# Patient Record
Sex: Male | Born: 1960
Health system: Southern US, Community
[De-identification: ages and names within clinical notes are randomized; demographics above are authoritative.]

## PROBLEM LIST (undated history)

## (undated) DIAGNOSIS — R7303 Prediabetes: Secondary | ICD-10-CM

## (undated) DIAGNOSIS — E663 Overweight: Secondary | ICD-10-CM

## (undated) DIAGNOSIS — K429 Umbilical hernia without obstruction or gangrene: Secondary | ICD-10-CM

## (undated) DIAGNOSIS — Z973 Presence of spectacles and contact lenses: Secondary | ICD-10-CM

## (undated) DIAGNOSIS — E291 Testicular hypofunction: Secondary | ICD-10-CM

## (undated) DIAGNOSIS — E230 Hypopituitarism: Secondary | ICD-10-CM

## (undated) DIAGNOSIS — R300 Dysuria: Secondary | ICD-10-CM

## (undated) DIAGNOSIS — N200 Calculus of kidney: Secondary | ICD-10-CM

## (undated) DIAGNOSIS — Z87442 Personal history of urinary calculi: Secondary | ICD-10-CM

## (undated) DIAGNOSIS — I10 Essential (primary) hypertension: Secondary | ICD-10-CM

## (undated) DIAGNOSIS — C4491 Basal cell carcinoma of skin, unspecified: Secondary | ICD-10-CM

## (undated) DIAGNOSIS — R600 Localized edema: Secondary | ICD-10-CM

## (undated) DIAGNOSIS — Z9989 Dependence on other enabling machines and devices: Secondary | ICD-10-CM

## (undated) DIAGNOSIS — G4733 Obstructive sleep apnea (adult) (pediatric): Secondary | ICD-10-CM

## (undated) HISTORY — DX: Dysuria: R30.0

## (undated) HISTORY — DX: Localized edema: R60.0

## (undated) HISTORY — DX: Calculus of kidney: N20.0

## (undated) HISTORY — PX: COLONOSCOPY: SHX174

## (undated) HISTORY — PX: PATELLECTOMY: SHX1022

## (undated) HISTORY — DX: Overweight: E66.3

## (undated) HISTORY — PX: EXTRACORPOREAL SHOCK WAVE LITHOTRIPSY: SHX1557

## (undated) HISTORY — PX: UMBILICAL HERNIA REPAIR: SUR1181

---
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
## 1997-09-10 ENCOUNTER — Ambulatory Visit: Admission: RE | Admit: 1997-09-10 | Discharge: 1997-09-10 | Payer: Self-pay | Admitting: Otolaryngology

## 2002-04-07 ENCOUNTER — Encounter: Admission: RE | Admit: 2002-04-07 | Discharge: 2002-07-06 | Payer: Self-pay | Admitting: Family Medicine

## 2003-07-12 ENCOUNTER — Ambulatory Visit (HOSPITAL_COMMUNITY): Admission: RE | Admit: 2003-07-12 | Discharge: 2003-07-13 | Payer: Self-pay | Admitting: General Surgery

## 2003-07-12 ENCOUNTER — Encounter (INDEPENDENT_AMBULATORY_CARE_PROVIDER_SITE_OTHER): Payer: Self-pay | Admitting: *Deleted

## 2003-07-14 DIAGNOSIS — K429 Umbilical hernia without obstruction or gangrene: Secondary | ICD-10-CM

## 2003-07-14 HISTORY — DX: Umbilical hernia without obstruction or gangrene: K42.9

## 2005-06-05 ENCOUNTER — Encounter: Admission: RE | Admit: 2005-06-05 | Discharge: 2005-06-05 | Payer: Self-pay | Admitting: Family Medicine

## 2005-06-10 ENCOUNTER — Encounter: Admission: RE | Admit: 2005-06-10 | Discharge: 2005-06-10 | Payer: Self-pay | Admitting: Family Medicine

## 2005-09-26 ENCOUNTER — Encounter: Admission: RE | Admit: 2005-09-26 | Discharge: 2005-09-26 | Payer: Self-pay | Admitting: Family Medicine

## 2006-06-11 ENCOUNTER — Ambulatory Visit: Payer: Self-pay | Admitting: Family Medicine

## 2006-06-11 LAB — CONVERTED CEMR LAB
Albumin: 3.9 g/dL (ref 3.5–5.2)
Alkaline Phosphatase: 98 units/L (ref 39–117)
BUN: 10 mg/dL (ref 6–23)
Basophils Absolute: 0.1 10*3/uL (ref 0.0–0.1)
Bilirubin, Direct: 0.3 mg/dL (ref 0.0–0.3)
Calcium: 9.5 mg/dL (ref 8.4–10.5)
Chloride: 100 meq/L (ref 96–112)
Eosinophils Relative: 2.8 % (ref 0.0–5.0)
GFR calc Af Amer: 117 mL/min
Glucose, Bld: 231 mg/dL — ABNORMAL HIGH (ref 70–99)
Hemoglobin: 14.6 g/dL (ref 13.0–17.0)
Monocytes Relative: 8.6 % (ref 3.0–11.0)
Platelets: 148 10*3/uL — ABNORMAL LOW (ref 150–400)
Potassium: 4.2 meq/L (ref 3.5–5.1)
RDW: 14.2 % (ref 11.5–14.6)
Sodium: 136 meq/L (ref 135–145)
TSH: 1.33 microintl units/mL (ref 0.35–5.50)
Total Protein: 6.8 g/dL (ref 6.0–8.3)

## 2006-06-19 ENCOUNTER — Ambulatory Visit: Payer: Self-pay | Admitting: Endocrinology

## 2006-06-19 LAB — CONVERTED CEMR LAB
LH: 3.4 milliintl units/mL
PTH: 31.8 pg/mL (ref 14.0–72.0)
Testosterone: 261.21 ng/dL — ABNORMAL LOW (ref 350.00–890)

## 2006-06-22 ENCOUNTER — Ambulatory Visit: Payer: Self-pay | Admitting: Internal Medicine

## 2006-06-30 ENCOUNTER — Encounter: Admission: RE | Admit: 2006-06-30 | Discharge: 2006-06-30 | Payer: Self-pay | Admitting: Endocrinology

## 2006-07-03 ENCOUNTER — Ambulatory Visit: Payer: Self-pay | Admitting: Endocrinology

## 2006-07-09 ENCOUNTER — Ambulatory Visit: Payer: Self-pay | Admitting: Family Medicine

## 2006-07-09 LAB — CONVERTED CEMR LAB
BUN: 14 mg/dL (ref 6–23)
Calcium: 9.9 mg/dL (ref 8.4–10.5)
Glucose, Bld: 119 mg/dL — ABNORMAL HIGH (ref 70–99)
Triglycerides: 209 mg/dL — ABNORMAL HIGH (ref <150)

## 2006-07-21 ENCOUNTER — Ambulatory Visit: Payer: Self-pay | Admitting: Family Medicine

## 2006-08-13 ENCOUNTER — Ambulatory Visit: Payer: Self-pay | Admitting: Endocrinology

## 2006-10-06 DIAGNOSIS — F329 Major depressive disorder, single episode, unspecified: Secondary | ICD-10-CM | POA: Insufficient documentation

## 2006-10-06 DIAGNOSIS — F3289 Other specified depressive episodes: Secondary | ICD-10-CM | POA: Insufficient documentation

## 2006-10-06 DIAGNOSIS — M810 Age-related osteoporosis without current pathological fracture: Secondary | ICD-10-CM | POA: Insufficient documentation

## 2006-10-26 ENCOUNTER — Ambulatory Visit: Payer: Self-pay | Admitting: Endocrinology

## 2006-10-26 LAB — CONVERTED CEMR LAB
Hgb A1c MFr Bld: 9.5 % — ABNORMAL HIGH (ref 4.6–6.0)
PSA: 0.77 ng/mL (ref 0.10–4.00)
Testosterone: 256.52 ng/dL — ABNORMAL LOW (ref 350.00–890)

## 2006-10-28 ENCOUNTER — Ambulatory Visit: Payer: Self-pay | Admitting: Endocrinology

## 2007-06-07 ENCOUNTER — Ambulatory Visit: Payer: Self-pay | Admitting: Family Medicine

## 2007-06-07 DIAGNOSIS — E1165 Type 2 diabetes mellitus with hyperglycemia: Secondary | ICD-10-CM | POA: Insufficient documentation

## 2007-06-07 DIAGNOSIS — I10 Essential (primary) hypertension: Secondary | ICD-10-CM | POA: Insufficient documentation

## 2007-06-17 ENCOUNTER — Ambulatory Visit: Payer: Self-pay | Admitting: Family Medicine

## 2007-06-22 LAB — CONVERTED CEMR LAB
ALT: 58 units/L — ABNORMAL HIGH (ref 0–53)
AST: 41 units/L — ABNORMAL HIGH (ref 0–37)
HDL: 31.8 mg/dL — ABNORMAL LOW (ref >39.0)
LDL Cholesterol: 78 mg/dL (ref 0–99)
Total CHOL/HDL Ratio: 4
Triglycerides: 93 mg/dL (ref 0–149)
VLDL: 19 mg/dL (ref 0–40)

## 2007-06-23 ENCOUNTER — Encounter (INDEPENDENT_AMBULATORY_CARE_PROVIDER_SITE_OTHER): Payer: Self-pay | Admitting: *Deleted

## 2007-10-12 ENCOUNTER — Ambulatory Visit: Payer: Self-pay | Admitting: Endocrinology

## 2007-10-12 DIAGNOSIS — G4730 Sleep apnea, unspecified: Secondary | ICD-10-CM | POA: Insufficient documentation

## 2007-10-12 DIAGNOSIS — E230 Hypopituitarism: Secondary | ICD-10-CM | POA: Insufficient documentation

## 2007-10-12 DIAGNOSIS — E291 Testicular hypofunction: Secondary | ICD-10-CM | POA: Insufficient documentation

## 2007-10-26 ENCOUNTER — Ambulatory Visit: Payer: Self-pay | Admitting: Pulmonary Disease

## 2007-10-26 DIAGNOSIS — Z87898 Personal history of other specified conditions: Secondary | ICD-10-CM | POA: Insufficient documentation

## 2007-11-26 ENCOUNTER — Encounter: Payer: Self-pay | Admitting: Pulmonary Disease

## 2008-02-03 ENCOUNTER — Ambulatory Visit: Payer: Self-pay | Admitting: Endocrinology

## 2008-07-25 ENCOUNTER — Ambulatory Visit: Payer: Self-pay | Admitting: Internal Medicine

## 2008-07-25 ENCOUNTER — Encounter: Payer: Self-pay | Admitting: Endocrinology

## 2008-09-20 ENCOUNTER — Telehealth (INDEPENDENT_AMBULATORY_CARE_PROVIDER_SITE_OTHER): Payer: Self-pay | Admitting: *Deleted

## 2008-10-02 ENCOUNTER — Encounter: Admission: RE | Admit: 2008-10-02 | Discharge: 2008-10-02 | Payer: Self-pay | Admitting: Family Medicine

## 2009-04-25 ENCOUNTER — Encounter: Admission: RE | Admit: 2009-04-25 | Discharge: 2009-04-25 | Payer: Self-pay | Admitting: Family Medicine

## 2009-04-30 ENCOUNTER — Ambulatory Visit (HOSPITAL_COMMUNITY): Admission: RE | Admit: 2009-04-30 | Discharge: 2009-04-30 | Payer: Self-pay | Admitting: Urology

## 2010-09-06 NOTE — Consult Note (Signed)
[facility]                          ENDOCRINOLOGY CONSULTATION   NAME:Andre Morrison, Andre Morrison                    MRN:          [id number]  DATE:06/19/2006                            DOB:          11/04/1960    REFERRING PHYSICIAN:  Leanne Chang, M.D.   REASON FOR REFERRAL:  Osteoporosis.   HISTORY OF PRESENT ILLNESS:  A 50 year old man who one year ago had a  minor fall and suffered a spinal fracture.  In the workup of this, he  was found to have osteoporosis, and he also had an elevated 24-hour  urine calcium excretion.  He refused Forteo.   He also has a history of elevated hepatic transaminases of unknown  cause.   He states that he also was found at another practice to have a low  testosterone, and he asked that this be evaluated.   PAST MEDICAL HISTORY:  1. Urolithiasis.  2. He drinks alcohol 2-3 drinks per month.   SOCIAL HISTORY:  He is married.  He runs a Estate manager/land agent.   FAMILY HISTORY:  Positive for osteoporosis in his mother and sister.   REVIEW OF SYSTEMS:  Denies syncope and denies erectile dysfunction.   PHYSICAL EXAMINATION:  VITAL SIGNS:  Blood pressure 154/89, heart rate  58, temperature 97.  Weight is 291.  GENERAL:  No distress.  He is overweight.  SKIN:  Normal texture and temperature.  No rash.  HEENT:  No proptosis.  No periorbital swelling.  NECK:  Thyroid is normal.  CARDIOVASCULAR:  No edema.  ABDOMEN:  No hepatosplenomegaly.  EXTERNAL GENITALIA:  Normal, including normal-sized testicles.  MUSCULOSKELETAL:  Gait is observed in the office to be normal.  On both  upper extremities, including the hands and fingers, I see no deformity.  NEUROLOGIC:  Alert and oriented.  Does not appear anxious nor depressed.   LABORATORY STUDIES:  On June 19, 2006, FSH 5.4, LH 3.4, prolactin  6.1, testosterone 261.   Thoracic spine on June 05, 2005 shows a wedge fracture of the sixth  thoracic  vertebra.   Abdominal ultrasound shows NASH.   TSH normal on June 11, 2006.  Also on June 11, 2006, AST 89, ALT  124.   IMPRESSION:  1. Secondary hypogonadism, uncertain etiology.  2. Elevated hepatic transaminases, uncertain etiology.  3. Osteoporosis, question related to #1.   PLAN:  1. I advised the patient to reconsider the Forteo.  2. Recheck parathyroid level.  3. Check MRI of the pituitary.  4. If the MRI of the pituitary is unremarkable, Clomid can be      considered.     [name] All, MD  Electronically Signed    SAE/MedQ  DD: 06/22/2006  DT: 06/23/2006  Job #: [id number]   cc:   Leanne Chang, M.D.

## 2010-09-06 NOTE — Consult Note (Signed)
[facility]                          ENDOCRINOLOGY CONSULTATION   NAME:Andre Morrison, Andre Morrison                    MRN:          [id number]  DATE:07/03/2006                            DOB:          1960/11/06    REASON FOR VISIT:  Followup hypogonadism.   HISTORY OF PRESENT ILLNESS:  A 50 year old man with hypogonadism. No  change in his symptoms. He was also recently noted to have osteoporosis  on a DEXA.   He recently had an MRI which showed an incidental abnormality.   PAST MEDICAL HISTORY:  1. Urolithiasis.  2. Elevated hepatic transaminases.   REVIEW OF SYSTEMS:  Denies weight gain and weight loss.   PHYSICAL EXAMINATION:  VITAL SIGNS:  Blood pressure 145/90, heart rate  is 85, temperature is 98.1, the weight is 290.  GENERAL:  No distress.  BREASTS:  Not enlarged.   LABORATORY DATA:  On June 30, 2006, MRI of the pituitary is normal but  there is another small focus noted in the brain. On June 19, 2006,  FSH 5.4, LH 3.4, prolactin 6.1, testosterone 261. Parathyroid hormone  normal.   IMPRESSION:  1. Mild idiopathic central hypogonadism.  2. Osteoporosis. It is uncertain to what extent this is contributed to      by his hypogonadism.  3. Abnormal MRI of the brain with an incidental focus noted.   PLAN:  1. Clomid 12.5 mg Monday, Wednesday and Friday.  2. Weight loss is advised.  3. Recheck DEXA next year.  4. Schedule a recheck MRI upon return in a few months.     [name] All, MD  Electronically Signed    SAE/MedQ  DD: 07/05/2006  DT: 07/06/2006  Job #: [id number]   cc:   Leanne Chang, M.D.

## 2010-09-06 NOTE — Op Note (Signed)
NAME:  Andre Morrison, Andre Morrison NO.:  [id number]   MEDICAL RECORD NO.:  [id number]                   PATIENT TYPE:  OIB   LOCATION:  2899                                 FACILITY:  [facility]   PHYSICIAN:  Leonie Man, M.D.                DATE OF BIRTH:  [birth year]   DATE OF PROCEDURE:  07/12/2003  DATE OF DISCHARGE:                                 OPERATIVE REPORT   PREOPERATIVE DIAGNOSIS:  Incarcerated umbilical hernia.   POSTOPERATIVE DIAGNOSIS:  Incarcerated umbilical hernia.   OPERATION PERFORMED:  Repair of incarcerated umbilical hernia.   SURGEON:  Leonie Man, M.D.   ASSISTANT:  Nurse.   ANESTHESIA:  General.   INDICATIONS FOR PROCEDURE:  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  He noted a bulge in his umbilicus with some increasing discomfort  after eating.  On evaluation, he has an incarcerated umbilical hernia and  comes to the operating room now for repair.  The treatment plan has been  fully discussed with the patient and risks and benefits of surgery have been  described.  He understands this and gives consent.   DESCRIPTION OF PROCEDURE:  Following induction of satisfactory general  anesthesia, with the patient positioned supinely, the abdomen was prepped  and draped to be included in a sterile operative field.  An infraumbilical  curvilinear incision was made, deepened through the skin and subcutaneous  tissue ___________ skin with a flap and dissecting the incarcerated hernia  sac from off of the underside of the umbilical skin.  The sac was dissected  down to the fascia.  The sac was removed in its entirety and the  incarcerated omentum fat was reduced back into the peritoneal cavity.  The  defect was cleared on all sides for repair.  I used a Bard plug to repair  the defect using interrupted sutures of 0 Novofil.  These were placed  interruptedly  around the plug holding them in place to the fascia.  At the  end of the repair, the repair seemed to be intact.  Sponge, instrument and  sharp counts were then verified.  Wound irrigated with normal saline.  Subcutaneous tissues closed with a running 2-0 Vicryl suture and the skin  closed with running 4-0 Monocryl suture.  Sterile dressings were applied.  The anesthetic reversed and the patient removed from the operating room to  the recovery room in stable condition.  He tolerated the procedure well.                                               Leonie Man, M.D.    PB/MEDQ  D:  07/12/2003  T:  07/13/2003  Job:  [id number] 

## 2010-09-06 NOTE — Consult Note (Signed)
[facility]                          ENDOCRINOLOGY CONSULTATION   NAME:Andre Morrison                    MRN:          [id number]  DATE:08/13/2006                            DOB:          [birth year]    REASON FOR VISIT:  Followup hypogonadism.   HISTORY OF PRESENT ILLNESS:  This is a 50 year old man who states he  feels no different since he has been taking Clomid 12.5 mg Monday,  Wednesday, and Friday.   He was also recently found by Dr. Blossom Hoops to have type 2 diabetes and  asked me my opinion about this.   FAMILY HISTORY:  Negative for diabetes.   REVIEW OF SYSTEMS:  No recent significant change in his weight.   PHYSICAL EXAMINATION:  VITAL SIGNS: Blood pressure 146/86, heart rate  75, temperature 99.1, weight 283.  GENERAL:  Obese.  EXTREMITIES:  No edema.   IMPRESSION:  1. Central hypogonadism.  2. Recently diagnosed type 2 diabetes.   PLAN:  1. Glucophage XR 1000 mg a day.  2. Same amount of Clomid.  3. Return in 3 months with a testosterone and A1c prior.  4. A 20-minute in a 30-minute office visit counseling about the      pathogenesis, propagating factors, treatment options, and risks of      type 2 diabetes.     [name] All, MD  Electronically Signed    SAE/MedQ  DD: 08/16/2006  DT: 08/16/2006  Job #: [id number]   cc:   Leanne Chang, M.D.

## 2010-09-06 NOTE — Assessment & Plan Note (Signed)
[facility]                        [facility] OFFICE NOTE   NAME:Andre Morrison, Andre Morrison                    MRN:          [id number]  DATE:06/11/2006                            DOB:          [birth year]    REASON FOR VISIT:  Medication refill.   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  He presents today to get a refill on his medicine, i.e.,  Cymbalta which he takes 60 mg daily.  Prior to entering the room, I did  review his medical record from [place].  I noted that the last visit was  discussion of elevated LFTs.  The patient reports that he was seen by GI  and is under evaluation and he was advised to follow up in one year.  Additionally, Andre Morrison has a history of premature osteoporosis and  was sent to Dr. Lurene Shadow.  According to the patient, he was given multiple  options for therapy and currently is taking over-the-counter vitamin D.  According to Andre Morrison, given the long trip to [facility] and the  co-pay, subsequent followup was very minimal.  He would prefer to be  referred to a local endocrinologist to continue evaluation and therapy  for osteoporosis in a 50 year old male.  The patient has no additional  concerns.   PAST MEDICAL HISTORY:  1. Osteoporosis.  2. Elevated LFTs.  3. Depression.  4. History of obstructive sleep apnea.  5. Perennial allergic rhinitis.  6. History of nephrolithiasis.  7. Varicose vein surgery in 1984.   MEDICATIONS:  1. Cymbalta 60 mg every day.  2. Vitamin D 1,000 mg daily.   ALLERGIES:  1. TETRACYCLINE.  2. PENICILLIN.   REVIEW OF SYSTEMS:  As per HPI.  Additionally, the patient reports that  off and on his blood pressure has been elevated when he checks it.   PHYSICAL EXAMINATION:  VITAL SIGNS:  Weight 293.6, temperature 98, pulse  68, blood pressure 150/92.  GENERAL:  A pleasant male in no acute distress.  Overweight.  NECK:  Supple.  No  lymphadenopathy, carotid bruits, or JVD, no  thyromegaly.  LUNGS:  Clear.  HEART:  Regular rate and rhythm.  Normal S1 S2.  No murmurs, gallops, or  rubs.  EXTREMITIES:  No cyanosis, clubbing, or edema.   EKG shows a normal sinus rhythm with no ST elevation or depression.  No  PVCs or PACs.  No Q waves.   IMPRESSION:  1. Depression stable on Cymbalta 60 mg.  2. Osteoporosis, previously followed closely by Dr. Lurene Shadow.  3. Elevated LFTs, followed closely by Dr. Randa Evens at [facility].  4. Hypertension, newly diagnosed.   PLAN:  1. We will refill his Cymbalta 60 mg daily, 30, with 3 refills.  2. In regards to his hypertension, the patient refuses any pills.  He      says he cannot swallow pills.  Because on this, he needs a capsule      that can be sprinkled on his food or mixed with fluids or he will      not be  able to take it.  Thus, I am considering a patch after      review of his laboratory data which include a comprehensive      metabolic profile, CBC, TSH.  I will most likely pick the clonidine      patch.  3. Regarding his osteoporosis, I will refer the patient to [facility]      Endocrinology for further evaluation and therapy.  The patient will      attempt to get medical records from Dr. Lurene Shadow.  4. We will defer elevated LFTs to Dr. Randa Evens.  5. The patient is to follow up with me in five weeks or sooner      depending on review of the laboratory data.     Leanne Chang, M.D.  Electronically Signed    LA/MedQ  DD: 06/11/2006  DT: 06/11/2006  Job #: [id number]

## 2013-12-02 ENCOUNTER — Encounter: Payer: Self-pay | Admitting: *Deleted

## 2016-06-19 ENCOUNTER — Emergency Department (HOSPITAL_COMMUNITY)
Admission: EM | Admit: 2016-06-19 | Discharge: 2016-06-19 | Disposition: A | Discharge status: Home / Self Care | Payer: Worker's Compensation | Attending: Emergency Medicine | Admitting: Emergency Medicine

## 2016-06-19 ENCOUNTER — Encounter (HOSPITAL_COMMUNITY): Payer: Self-pay

## 2016-06-19 ENCOUNTER — Emergency Department (HOSPITAL_COMMUNITY): Payer: Worker's Compensation

## 2016-06-19 DIAGNOSIS — I10 Essential (primary) hypertension: Secondary | ICD-10-CM | POA: Insufficient documentation

## 2016-06-19 DIAGNOSIS — Y9301 Activity, walking, marching and hiking: Secondary | ICD-10-CM | POA: Diagnosis not present

## 2016-06-19 DIAGNOSIS — W010XXA Fall on same level from slipping, tripping and stumbling without subsequent striking against object, initial encounter: Secondary | ICD-10-CM | POA: Insufficient documentation

## 2016-06-19 DIAGNOSIS — E119 Type 2 diabetes mellitus without complications: Secondary | ICD-10-CM | POA: Insufficient documentation

## 2016-06-19 DIAGNOSIS — W19XXXA Unspecified fall, initial encounter: Secondary | ICD-10-CM

## 2016-06-19 DIAGNOSIS — Y999 Unspecified external cause status: Secondary | ICD-10-CM | POA: Diagnosis not present

## 2016-06-19 DIAGNOSIS — Y929 Unspecified place or not applicable: Secondary | ICD-10-CM | POA: Insufficient documentation

## 2016-06-19 DIAGNOSIS — S52551A Other extraarticular fracture of lower end of right radius, initial encounter for closed fracture: Secondary | ICD-10-CM | POA: Diagnosis not present

## 2016-06-19 DIAGNOSIS — Z79899 Other long term (current) drug therapy: Secondary | ICD-10-CM | POA: Diagnosis not present

## 2016-06-19 DIAGNOSIS — S8992XA Unspecified injury of left lower leg, initial encounter: Secondary | ICD-10-CM | POA: Diagnosis present

## 2016-06-19 DIAGNOSIS — S82032A Displaced transverse fracture of left patella, initial encounter for closed fracture: Secondary | ICD-10-CM | POA: Insufficient documentation

## 2016-06-19 LAB — CBG MONITORING, ED: GLUCOSE-CAPILLARY: 106 mg/dL — AB (ref 65–99)

## 2016-06-19 MED ORDER — OXYCODONE-ACETAMINOPHEN 5-325 MG PO TABS: 2.0000 | ORAL_TABLET | ORAL | 0 refills | Status: DC | PRN

## 2016-06-19 NOTE — ED Notes (Signed)
Pt transported to XR.  

## 2016-06-19 NOTE — ED Notes (Signed)
Bed: WTR6 Expected date:  Expected time:  Means of arrival:  Comments: EMS 56 yo, knee pain

## 2016-06-19 NOTE — ED Notes (Signed)
Pt states that he is unable to urinate at this time d/t "shy bladder"

## 2016-06-19 NOTE — Discharge Instructions (Signed)
As we discussed you have a transverse, displaced patellar fracture which will need immobilization, ice and surgical repair. Please wear knee immobilizer and use crutches to avoid placing weight on left leg. It is really important that you aggressively ice your knee and elevate your left leg to decrease inflammation. The orthopedic surgeon will call you tomorrow morning to schedule an appointment with him in his office tomorrow morning and he will plan surgery as soon as your inflammation has decreased.  Unfortunately also have a nondisplaced, stable fracture of your radius. This has been temporarily stabilized and immobilized in order to allow you to still use your right hand to use your crutches. The orthopedic surgeon will address this fracture in his office tomorrow. Please take prescribed Percocet for severe pain. You may take 650 mg of Tylenol for mild-to-moderate pain.

## 2016-06-19 NOTE — ED Triage Notes (Signed)
Pt BIB [facility] from work. He tripped on a piece of material and fell on to his L knee. No pain when leg is straight. When bending pt states that knee pain is a 10/10. No LOC, denies hitting head. Not on blood thinners.

## 2016-06-19 NOTE — ED Provider Notes (Signed)
[facility] DEPT Provider Note   CSN: [id number] Arrival date & time: 06/19/16  1526   By signing my name below, I, Andre Morrison, attest that this documentation has been prepared under the direction and in the presence of Andre Sails, PA-C. Electronically Signed: Evelene Morrison, Scribe. 06/19/2016. 4:50 PM.  History   Chief Complaint Chief Complaint  Patient presents with  . Knee Pain    Left     The history is provided by the patient. No language interpreter was used.    HPI Comments:  Andre Morrison is a 56 y.o. male who presents to the Emergency Department via EMS, complaining of left knee pain s/p fall today. He has associated right wrist pain. Pt was walking between 2 skids ~ 1 foot apart when the left foot caught on to some shrink wrap and caused him to fall outside of the skid and onto the left knee. Pt states he broke part of his fall with his right wrist and when he tried to stand he was unable to.  He reports exacerbation of pain with movement as well as radiation of pain up to the left hip region.  No alleviating factors noted. Pt denies head injury, LOC, and numbness/tingling in his extremities.  He also denies use of blood thinners other than ASA.   4:53 PM At this time pt states he feels faint, cold and clammy; states he has only felt like this for the past 5 minutes. He notes mild nausea as well. He denies HA, CP and SOB.  Symptoms started when I had removed patient's brace and exposed his knee and started left knee examination.  Past Medical History:  Diagnosis Date  . Dysuria   . Elevated blood pressure reading without diagnosis of hypertension   . Other testicular hypofunction   . Type II or unspecified type diabetes mellitus without mention of complication, not stated as uncontrolled     Patient Active Problem List   Diagnosis Date Noted  . Displaced transverse fracture of left patella, subsequent encounter for closed fracture with delayed healing  06/20/2016  . Left patella fracture 06/20/2016  . UMBILICAL HERNIA, HX OF 94/70/9628  . PITUITARY INSUFFICIENCY 10/12/2007  . HYPOGONADISM, MALE 10/12/2007  . SLEEP APNEA 10/12/2007  . DIABETES MELLITUS, TYPE II 06/07/2007  . HYPERTENSION 06/07/2007  . DEPRESSION 10/06/2006  . OSTEOPOROSIS 10/06/2006    History reviewed. No pertinent surgical history.     Home Medications    Prior to Admission medications   Medication Sig Start Date End Date Taking? Authorizing Provider  HYDROcodone-acetaminophen (HYCET) 7.5-325 mg/15 ml solution Take 5-10 mLs by mouth 4 (four) times daily as needed for moderate pain. 06/20/16   [name], MD  losartan (COZAAR) 25 MG tablet  06/09/16   Historical Provider, MD  oxyCODONE-acetaminophen (PERCOCET/ROXICET) 5-325 MG tablet Take 2 tablets by mouth every 4 (four) hours as needed for severe pain. Patient not taking: Reported on 06/20/2016 06/19/16   [name], PA-C  Prenatal Vit-Fe Fumarate-FA (M-VIT PO) Take by mouth as directed.    Historical Provider, MD  Testosterone Cypionate 200 MG/ML KIT Inject into the muscle every 21 ( twenty-one) days.    Historical Provider, MD    Family History Family History  Problem Relation Age of Onset  . Family history unknown: Yes    Social History Social History  Substance Use Topics  . Smoking status: Never Smoker  . Smokeless tobacco: Never Used  . Alcohol use Not on file  Allergies   Penicillins; Tetracycline; Tetracyclines & related; and Penicillin g   Review of Systems Review of Systems  Constitutional: Positive for diaphoresis.  Respiratory: Negative for shortness of breath.   Cardiovascular: Negative for chest pain.  Gastrointestinal: Positive for nausea. Negative for vomiting.  Musculoskeletal: Positive for arthralgias, gait problem and joint swelling. Negative for myalgias.  Skin: Negative for wound.  Neurological: Positive for light-headedness. Negative for syncope.     Physical  Exam Updated Vital Signs BP 153/75 (BP Location: Left Arm)   Pulse 77   Temp 99.1 F (37.3 C) (Oral)   Resp 17   SpO2 96%   Physical Exam  Constitutional: He is oriented to person, place, and time. He appears well-developed and well-nourished. No distress.  HENT:  Head: Normocephalic and atraumatic.  Right Ear: External ear normal.  Left Ear: External ear normal.  Nose: Nose normal.  Mouth/Throat: Oropharynx is clear and moist. No oropharyngeal exudate.  Eyes: Conjunctivae and EOM are normal. Pupils are equal, round, and reactive to light. No scleral icterus.  Neck: Normal range of motion. Neck supple. No JVD present. No tracheal deviation present.  Cardiovascular: Normal rate, regular rhythm, normal heart sounds and intact distal pulses.   No murmur heard. Pulmonary/Chest: Effort normal and breath sounds normal. He has no wheezes.  Abdominal: Soft. He exhibits no distension. There is no tenderness.  Musculoskeletal: He exhibits edema, tenderness and deformity.       Left knee: He exhibits decreased range of motion, swelling, effusion, deformity, abnormal patellar mobility and bony tenderness. Tenderness found. Medial joint line, lateral joint line, MCL, LCL and patellar tendon tenderness noted.  RUE: Tenderness over the dorsal aspect of the distal radius  No tenderness over the scaphoid Point tender over the ulnar styloid  LEFT KNEE: Diffuse edema over the anterior aspect of the right patella with mild tenderness. There is obvious deformity to patella, patella palpation revealed patella split in two pieces. Decreased left knee ROM.  Lymphadenopathy:    He has no cervical adenopathy.  Neurological: He is alert and oriented to person, place, and time.  5/5 strength with shoulder raise, abduction and adduction, bilaterally.  5/5 strength with elbow flexion and extension, bilaterally.  5/5 strength with wrist flexion and extension.  5/5 strength with finger abduction 2/4 biceps,  triceps and BR DTR bilaterally. Good pincer and hand grip bilaterally.  Sensation to light touch intact in median, ulnar and radial nerve distribution, bilaterally.   5/5 strength with hip flexion and extension, bilaterally.  5/5 strength with knee flexion and extension, bilaterally.  5/5 strength with ankle dorsiflexion and plantar flexion, bilaterally.  Sensation to light touch intact in the distribution of the obturator nerve, lateral cutaneous nerve, femoral nerve, common fibular nerve.  2/4 knee and ankle DTR bilaterally.    Foot: sensation to light touch intact in the distribution of the saphenous nerve, medial plantar nerve, lateral plantar nerve, bilaterally.    Skin: Skin is warm. Capillary refill takes less than 2 seconds. He is diaphoretic (slightly).  Psychiatric: He has a normal mood and affect. His behavior is normal. Judgment and thought content normal.  Nursing note and vitals reviewed.    ED Treatments / Results  DIAGNOSTIC STUDIES:  Oxygen Saturation is 97% on RA, normal by my interpretation.    COORDINATION OF CARE:  4:55 PM Discussed treatment plan with pt at bedside and pt agreed to plan.  Labs (all labs ordered are listed, but only abnormal results are displayed) Labs Reviewed  CBG MONITORING, ED - Abnormal; Notable for the following:       Result Value   Glucose-Capillary 106 (*)    All other components within normal limits  CBG MONITORING, ED    EKG  EKG Interpretation  Date/Time:  Thursday June 19 2016 17:06:01 EST Ventricular Rate:  60 PR Interval:    QRS Duration: 121 QT Interval:  396 QTC Calculation: 396 R Axis:   -25 Text Interpretation:  Sinus rhythm IVCD, consider atypical RBBB Left ventricular hypertrophy ST elevation, consider inferior injury Baseline wander in lead(s) V4 since last tracing no significant change Confirmed by Eulis Foster  MD, ELLIOTT ([id number]) on 06/19/2016 8:52:13 PM       Radiology No results  found.  Procedures Procedures (including critical care time)  Medications Ordered in ED Medications - No data to display   Initial Impression / Assessment and Plan / ED Course  I have reviewed the triage vital signs and the nursing notes.  Pertinent labs & imaging results that were available during my care of the patient were reviewed by me and considered in my medical decision making (see chart for details).  Clinical Course as of Jun 23 2303  Thu Jun 19, 2016  1629 FINDINGS: There is a transverse fracture through the inferior third of the patella with approximately 3.5 cm of distraction of the fracture fragments. No other fracture is seen. Degenerative changes are noted in the medial joint space. Soft tissue swelling over the patella is noted as well as a small joint effusion. DG Knee Complete 4 Views Left [CG]  1853 IMPRESSION: 1. Nondisplaced fracture of the distal radial metaphysis just proximal to the distal radioulnar joint without definite intra-articular involvement. 2. Ulnar positive variance with cystic change of the lunate would be in keeping with ulnocarpal abutment. DG Wrist Complete Right [CG]  1900 Ortho tech assisted patient and patient was able to ambulate 4+ steps with crutches.  Patient denies light-headedness, nausea, chest pain, shortness of breath while ambulating. Patient requests food.  [CG]  Sun Jun 22, 2016  2246 Glucose-Capillary: Marland Kitchen 106 [CG]    Clinical Course User Index [CG] [name], PA-C   Brief episode of nausea, diaphoresis and light headedness last 1-2 minutes during examination of left knee, resolved quickly.  Patient stated he had not eaten in a long time, requested food.  Patient states he felt better after having a BM and eating.  CBG within normal limits. I reviewed EKG, does not look ischemic to me.   I do not think further emergent lab or imaging is indicated at this time.  Patient reported these symptoms when I removed his knee  brace, exposed his left knee and was manipulating/examination of injured left knee.  There was no associated chest pain or shortness of breath.  5:30 PM Discussed case with Dr. Erlinda Hong (orthopedic surgeon) advises pt be discharged with a knee immobilizer and wrist brace to follow up in office tomorrow, suspect patient will need OR after inflammation decreases in 1-2 weeks.    Final Clinical Impressions(s) / ED Diagnoses   Final diagnoses:  Closed displaced transverse fracture of left patella, initial encounter  Other closed extra-articular fracture of distal end of right radius, initial encounter  Fall, initial encounter    New Prescriptions Discharge Medication List as of 06/19/2016  7:11 PM    START taking these medications   Details  oxyCODONE-acetaminophen (PERCOCET/ROXICET) 5-325 MG tablet Take 2 tablets by mouth every 4 (four) hours as needed for severe  pain., Starting Thu 06/19/2016, Print       I personally performed the services described in this documentation, which was scribed in my presence. The recorded information has been reviewed and is accurate.     [name], PA-C 06/22/16 [place], MD 06/23/16 [phone]

## 2016-06-19 NOTE — ED Notes (Signed)
Pt given bedside commode. Would like to wait on further treatment until he is finished.

## 2016-06-19 NOTE — ED Notes (Signed)
Pt requested to have jeans and underwear cut off and all other clothing removed. Pt placed in a gown.

## 2016-06-19 NOTE — ED Provider Notes (Signed)
  Face-to-face evaluation   History: Patient tripped and stepped awkwardly injuring his left knee and right wrist.  No head injury.  Unable to walk afterward because of trouble bending his left knee.  Presents by EMS.  Physical exam: Alert, cooperative.  Left knee swollen.  He is unable to extend the lower leg with straight leg raising test.  MDM-acute patellar fracture.  He will need outpatient management for surgical repair.  Medical screening examination/treatment/procedure(s) were conducted as a shared visit with non-physician practitioner(s) and myself.  I personally evaluated the patient during the encounter   Daleen Bo, MD 06/23/16 [phone]

## 2016-06-20 ENCOUNTER — Telehealth (INDEPENDENT_AMBULATORY_CARE_PROVIDER_SITE_OTHER): Payer: Self-pay | Admitting: *Deleted

## 2016-06-20 ENCOUNTER — Ambulatory Visit (INDEPENDENT_AMBULATORY_CARE_PROVIDER_SITE_OTHER): Payer: Worker's Compensation | Admitting: Orthopaedic Surgery

## 2016-06-20 ENCOUNTER — Encounter (INDEPENDENT_AMBULATORY_CARE_PROVIDER_SITE_OTHER): Payer: Self-pay | Admitting: Orthopaedic Surgery

## 2016-06-20 DIAGNOSIS — S82002A Unspecified fracture of left patella, initial encounter for closed fracture: Secondary | ICD-10-CM | POA: Insufficient documentation

## 2016-06-20 DIAGNOSIS — S82032A Displaced transverse fracture of left patella, initial encounter for closed fracture: Secondary | ICD-10-CM | POA: Diagnosis not present

## 2016-06-20 DIAGNOSIS — S82032G Displaced transverse fracture of left patella, subsequent encounter for closed fracture with delayed healing: Secondary | ICD-10-CM | POA: Insufficient documentation

## 2016-06-20 DIAGNOSIS — S52301A Unspecified fracture of shaft of right radius, initial encounter for closed fracture: Secondary | ICD-10-CM | POA: Diagnosis not present

## 2016-06-20 MED ORDER — HYDROCODONE-ACETAMINOPHEN 7.5-325 MG/15ML PO SOLN: 5.0000 mL | Freq: Four times a day (QID) | ORAL | 0 refills | Status: DC | PRN

## 2016-06-20 NOTE — Telephone Encounter (Signed)
WC adjuster needs office notes and RX copies faxed to [phone]

## 2016-06-20 NOTE — Progress Notes (Signed)
Office Visit Note   Patient: Andre Morrison           Date of Birth: 1960/12/22           MRN: [id number] Visit Date: 06/20/2016              Requested by: Renato Shin, MD [address] [place], [place] [address] PCP: Renato Shin, MD   Assessment & Plan: Visit Diagnoses:  1. Closed displaced transverse fracture of left patella, initial encounter     Plan: Patient has a nondisplaced distal radius fracture which we'll plan on treating nonoperatively with a short wrist brace. The patella fracture will need operative treatment likely partial patellectomy. I do want him to be out of work until surgery to allow the swelling to go down so that its amenable for surgery next week. Questions encouraged and answered. We discussed risks benefits alternatives to surgery. We will get him set up with DME and Bledsoe brace. Total face to face encounter time was greater than 45 minutes and over half of this time was spent in counseling and/or coordination of care.  Follow-Up Instructions: Return for 2 week postop visit.   Orders:  No orders of the defined types were placed in this encounter.  Meds ordered this encounter  Medications  . HYDROcodone-acetaminophen (HYCET) 7.5-325 mg/15 ml solution    Sig: Take 5-10 mLs by mouth 4 (four) times daily as needed for moderate pain.    Dispense:  473 mL    Refill:  0      Procedures: No procedures performed   Clinical Data: No additional findings.   Subjective: Chief Complaint  Patient presents with  . Left Knee - Pain    Patient is 56 year old gentleman who injured his left knee and right wrist yesterday at work when some pallets fell onto him. He was evaluated in the ER and diagnosed with a nondisplaced distal radius fracture and a displaced left patella fracture. He follows up today for further evaluation and treatment. He complains of minimal pain. He is ambulating well in a knee immobilizer.     Review of Systems    Constitutional: Negative.   All other systems reviewed and are negative.    Objective: Vital Signs: There were no vitals taken for this visit.  Physical Exam  Constitutional: He is oriented to person, place, and time. He appears well-developed and well-nourished.  HENT:  Head: Normocephalic and atraumatic.  Eyes: Pupils are equal, round, and reactive to light.  Neck: Neck supple.  Pulmonary/Chest: Effort normal.  Abdominal: Soft.  Musculoskeletal: Normal range of motion.  Neurological: He is alert and oriented to person, place, and time.  Skin: Skin is warm.  Psychiatric: He has a normal mood and affect. His behavior is normal. Judgment and thought content normal.  Nursing note and vitals reviewed.   Ortho Exam Exam of his right wrist is essentially benign. He has no real tenderness to palpation. He does have pain with compression of the DRUJ. Exam of the left knee shows large joint effusion with severe swelling. There is no open wounds. He is neurovascular intact. Specialty Comments:  No specialty comments available.  Imaging: Dg Wrist Complete Right  Result Date: 06/19/2016 CLINICAL DATA:  Right wrist pain after fall. EXAM: RIGHT WRIST - COMPLETE 3+ VIEW COMPARISON:  None. FINDINGS: Ulnar positive variance with subcortical cystic change of the lunate. Acute, nondisplaced fracture of the distal medial radial metaphysis just proximal to the distal radioulnar joint.  The radiocarpal and intercarpal joint spaces are maintained and aligned. IMPRESSION: 1. Nondisplaced fracture of the distal radial metaphysis just proximal to the distal radioulnar joint without definite intra-articular involvement. 2. Ulnar positive variance with cystic change of the lunate would be in keeping with ulnocarpal abutment. Electronically Signed   By: Ashley Royalty M.D.   On: 06/19/2016 18:05   Dg Knee Complete 4 Views Left  Result Date: 06/19/2016 CLINICAL DATA:  Recent trip and fall with knee pain, initial  encounter EXAM: LEFT KNEE - COMPLETE 4+ VIEW COMPARISON:  None. FINDINGS: There is a transverse fracture through the inferior third of the patella with approximately 3.5 cm of distraction of the fracture fragments. No other fracture is seen. Degenerative changes are noted in the medial joint space. Soft tissue swelling over the patella is noted as well as a small joint effusion. IMPRESSION: Transverse fracture through the inferior third of the patella as described. Electronically Signed   By: Inez Catalina M.D.   On: 06/19/2016 16:21     PMFS History: Patient Active Problem List   Diagnosis Date Noted  . Displaced transverse fracture of left patella, subsequent encounter for closed fracture with delayed healing 06/20/2016  . Left patella fracture 06/20/2016  . UMBILICAL HERNIA, HX OF [id number]  . PITUITARY INSUFFICIENCY 10/12/2007  . HYPOGONADISM, MALE 10/12/2007  . SLEEP APNEA 10/12/2007  . DIABETES MELLITUS, TYPE II 06/07/2007  . HYPERTENSION 06/07/2007  . DEPRESSION 10/06/2006  . OSTEOPOROSIS 10/06/2006   Past Medical History:  Diagnosis Date  . Dysuria   . Elevated blood pressure reading without diagnosis of hypertension   . Other testicular hypofunction   . Type II or unspecified type diabetes mellitus without mention of complication, not stated as uncontrolled     Family History  Problem Relation Age of Onset  . Family history unknown: Yes    No past surgical history on file. Social History   Occupational History  . Not on file.   Social History Main Topics  . Smoking status: Never Smoker  . Smokeless tobacco: Never Used  . Alcohol use Not on file  . Drug use: Unknown  . Sexual activity: Not on file

## 2016-06-20 NOTE — Telephone Encounter (Signed)
Faxed office note and surgery sheet

## 2016-06-24 ENCOUNTER — Telehealth (INDEPENDENT_AMBULATORY_CARE_PROVIDER_SITE_OTHER): Payer: Self-pay | Admitting: Orthopaedic Surgery

## 2016-06-24 NOTE — Telephone Encounter (Signed)
Patient called having a few questions about the knee brace he just received. Wanted to speak with you before his surgery Friday. CB # [phone]

## 2016-06-24 NOTE — Telephone Encounter (Signed)
I called and spoke with patient he wanted to make sure he could sit in chair with leg elevated, it would not be at level of his heart, wanting to make sure this is ok with Dr. Erlinda Hong. Per Dr. Erlinda Hong this is ok as long as he has it iced.

## 2016-06-27 DIAGNOSIS — S82032A Displaced transverse fracture of left patella, initial encounter for closed fracture: Secondary | ICD-10-CM | POA: Diagnosis not present

## 2016-06-30 ENCOUNTER — Telehealth (INDEPENDENT_AMBULATORY_CARE_PROVIDER_SITE_OTHER): Payer: Self-pay | Admitting: *Deleted

## 2016-06-30 NOTE — Telephone Encounter (Signed)
Pt missed call from Dr. Erlinda Hong Friday and requesting call back. Pt has some questions for the Dr. Meriel Flavors: [phone]

## 2016-07-01 ENCOUNTER — Other Ambulatory Visit (INDEPENDENT_AMBULATORY_CARE_PROVIDER_SITE_OTHER): Payer: Self-pay | Admitting: Orthopaedic Surgery

## 2016-07-01 MED ORDER — METHOCARBAMOL 500 MG PO TABS: 500.0000 mg | ORAL_TABLET | Freq: Four times a day (QID) | ORAL | 2 refills | Status: DC | PRN

## 2016-07-01 NOTE — Telephone Encounter (Signed)
Patient called to check the status of a muscle relaxer that dr.xu was going to call in this morning for him [facility] on [address] Cb#:[phone]

## 2016-07-01 NOTE — Telephone Encounter (Signed)
Dr Erlinda Hong spoke to patient this AM

## 2016-07-04 ENCOUNTER — Telehealth (INDEPENDENT_AMBULATORY_CARE_PROVIDER_SITE_OTHER): Payer: Self-pay

## 2016-07-04 ENCOUNTER — Telehealth (INDEPENDENT_AMBULATORY_CARE_PROVIDER_SITE_OTHER): Payer: Self-pay | Admitting: Orthopaedic Surgery

## 2016-07-04 NOTE — Telephone Encounter (Signed)
Please call pt about post op issues.  Talked with Dr Erlinda Hong on the phone and he will call him now [phone]

## 2016-07-04 NOTE — Telephone Encounter (Signed)
Recovery incision area progressing well in healing, brace is at zero, changing dressing everyday since Monday. Motorized ice pack is wonderful, decided the pain has been minimal enough that he weaned himself from the [place] and is now taking Naproxen 500 mg-and took one at 6pm yesterday and one at 6:30 this a.m. Also taking the muscle relaxer you gave him to take as needed.  Started having some constipation but is taking fiber and Miralax. Appetite has come back.  The only unusual thing noticed, is muscle spasms in the upper back-that is described as a pinched nerve in the arm near the elbow that includes numbness and tingling.  He is not able to hold anything in the hand and experiencing pulsating pan.  Patient asking if this is common after his particular surgery.Pleas call and advise

## 2016-07-11 ENCOUNTER — Ambulatory Visit (INDEPENDENT_AMBULATORY_CARE_PROVIDER_SITE_OTHER): Payer: Worker's Compensation | Admitting: Orthopaedic Surgery

## 2016-07-11 ENCOUNTER — Encounter (INDEPENDENT_AMBULATORY_CARE_PROVIDER_SITE_OTHER): Payer: Self-pay | Admitting: Orthopaedic Surgery

## 2016-07-11 DIAGNOSIS — S82032A Displaced transverse fracture of left patella, initial encounter for closed fracture: Secondary | ICD-10-CM

## 2016-07-11 NOTE — Progress Notes (Signed)
Patient is 2 weeks status post partial patellectomy. He is doing well. Not taking any narcotic pain medicines. The incisions have healed without any complication. There is no signs of infection or dehiscence. Swelling is still moderate. His wrist is overall doing well. From a return to work standpoint I anticipate that he'll be able to return to full duty in about 6 months but can return to partial duty as early as 6 weeks.  He is to work on home exercises himself for range of motion. Bledsoe brace is open to 0-30. The sutures were removed. Continue cold compression therapy. We'll begin physical therapy in 6 weeks. Follow-up in 4 weeks with 2 view x-rays of the right wrist.

## 2016-07-14 ENCOUNTER — Inpatient Hospital Stay (INDEPENDENT_AMBULATORY_CARE_PROVIDER_SITE_OTHER): Payer: Self-pay | Admitting: Orthopaedic Surgery

## 2016-07-14 ENCOUNTER — Telehealth (INDEPENDENT_AMBULATORY_CARE_PROVIDER_SITE_OTHER): Payer: Self-pay

## 2016-07-14 NOTE — Telephone Encounter (Signed)
Received voicemail from wc adj and she needs updated work note from 07/11/16 visit and nothing is in chart. Needs to be faxed to [phone]

## 2016-07-15 ENCOUNTER — Telehealth (INDEPENDENT_AMBULATORY_CARE_PROVIDER_SITE_OTHER): Payer: Self-pay

## 2016-07-15 NOTE — Telephone Encounter (Signed)
yes

## 2016-07-15 NOTE — Telephone Encounter (Signed)
Work comp needs updated work note from the 07/11/16 office visit. Says she had sent a fax to Dr. Erlinda Hong asking if pt could do sit down work from his lap top?

## 2016-07-15 NOTE — Telephone Encounter (Signed)
Please advise 

## 2016-07-15 NOTE — Telephone Encounter (Signed)
Note done is there a Fax number.

## 2016-07-15 NOTE — Telephone Encounter (Signed)
Faxed to her @ [phone]

## 2016-08-11 ENCOUNTER — Ambulatory Visit (INDEPENDENT_AMBULATORY_CARE_PROVIDER_SITE_OTHER): Payer: Worker's Compensation

## 2016-08-11 ENCOUNTER — Encounter (INDEPENDENT_AMBULATORY_CARE_PROVIDER_SITE_OTHER): Payer: Self-pay | Admitting: Orthopaedic Surgery

## 2016-08-11 ENCOUNTER — Ambulatory Visit (INDEPENDENT_AMBULATORY_CARE_PROVIDER_SITE_OTHER): Payer: Worker's Compensation | Admitting: Orthopaedic Surgery

## 2016-08-11 DIAGNOSIS — S82032D Displaced transverse fracture of left patella, subsequent encounter for closed fracture with routine healing: Secondary | ICD-10-CM

## 2016-08-11 DIAGNOSIS — S52501A Unspecified fracture of the lower end of right radius, initial encounter for closed fracture: Secondary | ICD-10-CM

## 2016-08-11 NOTE — Progress Notes (Signed)
He is 6 weeks status post a right nondisplaced distal radius fracture and left patella fracture status post partial patellectomy and patellar tendon advancement. He is progressing appropriately. He still continues to have weakness and trouble with dexterity and stiffness with his right wrist. In terms of his left knee he has continued mild gait abnormality as expected with dependent edema and pitting edema. Range of motion is 0-50. He has slightly atrophied quadriceps muscle. X-ray show healed distal radius fracture. From my standpoint I would like him to begin physical therapy and occupational therapy for both his wrist and his knee. He continues to have deficits that'll require him to return to a modified duty as described in his job description. Sounds like transportation to and from work is an issue as and long as his work can provide transportation for him until he is able to drive himself on I'm fine with him going back to work.  I will like to see him back in 6 weeks for reevaluation. No x-rays needed. I anticipate that [place] for the right wrist will be around 4 months and 4-6 months for the left knee. I do not want him to do any kneeling as part of his modified job. Questions encouraged and answered today.

## 2016-08-14 ENCOUNTER — Telehealth (INDEPENDENT_AMBULATORY_CARE_PROVIDER_SITE_OTHER): Payer: Self-pay | Admitting: Orthopaedic Surgery

## 2016-08-14 ENCOUNTER — Telehealth (INDEPENDENT_AMBULATORY_CARE_PROVIDER_SITE_OTHER): Payer: Self-pay | Admitting: Radiology

## 2016-08-14 NOTE — Telephone Encounter (Signed)
Yes for both.  ROM, strengthening for both

## 2016-08-14 NOTE — Telephone Encounter (Signed)
PT calling to verify and confirm if pt is having phys therapy for knee and OC for the wrist   Caryl Pina- [phone]

## 2016-08-14 NOTE — Telephone Encounter (Signed)
See message below °

## 2016-08-14 NOTE — Telephone Encounter (Signed)
LMOM with details see message below.

## 2016-08-14 NOTE — Telephone Encounter (Signed)
See other msg

## 2016-08-14 NOTE — Telephone Encounter (Signed)
PT needs clarification on PT and OT for wrist and knee- Do you want OT for wrist and PT for knee?  Please clarify.

## 2016-08-20 ENCOUNTER — Other Ambulatory Visit (INDEPENDENT_AMBULATORY_CARE_PROVIDER_SITE_OTHER): Payer: Self-pay

## 2016-08-20 DIAGNOSIS — S82032G Displaced transverse fracture of left patella, subsequent encounter for closed fracture with delayed healing: Secondary | ICD-10-CM

## 2016-08-20 DIAGNOSIS — M25531 Pain in right wrist: Secondary | ICD-10-CM

## 2016-08-20 DIAGNOSIS — M25532 Pain in left wrist: Secondary | ICD-10-CM

## 2016-08-26 ENCOUNTER — Telehealth (INDEPENDENT_AMBULATORY_CARE_PROVIDER_SITE_OTHER): Payer: Self-pay

## 2016-08-26 NOTE — Telephone Encounter (Signed)
Faxed last office note and report

## 2016-08-26 NOTE — Telephone Encounter (Signed)
[facility] would like for x-ray report to be faxed to there office.  Fax number is [phone]

## 2016-09-02 ENCOUNTER — Telehealth (INDEPENDENT_AMBULATORY_CARE_PROVIDER_SITE_OTHER): Payer: Self-pay | Admitting: Orthopaedic Surgery

## 2016-09-02 NOTE — Telephone Encounter (Signed)
NICOLE @ PHYSICAL THERAP[facility] CALLED REQUESTING RECORDS FOR PTS UPCOMING APPT FOR THERAPIST. I FAXED OV NOTES [phone]

## 2016-09-25 ENCOUNTER — Ambulatory Visit (INDEPENDENT_AMBULATORY_CARE_PROVIDER_SITE_OTHER): Payer: Worker's Compensation | Admitting: Orthopaedic Surgery

## 2016-09-25 ENCOUNTER — Encounter (INDEPENDENT_AMBULATORY_CARE_PROVIDER_SITE_OTHER): Payer: Self-pay | Admitting: Orthopaedic Surgery

## 2016-09-25 DIAGNOSIS — S82032D Displaced transverse fracture of left patella, subsequent encounter for closed fracture with routine healing: Secondary | ICD-10-CM

## 2016-09-25 DIAGNOSIS — S52501A Unspecified fracture of the lower end of right radius, initial encounter for closed fracture: Secondary | ICD-10-CM

## 2016-09-25 NOTE — Progress Notes (Signed)
Andre Morrison is 3 months status post nonoperative right distal radius fracture and partial left patellectomy. He is doing modified duty. He has been to 11 sessions of physical therapy and doing well. He complains of low bit of stiffness of his index finger with flexion otherwise he is doing well. His range of motion of his knee has progressed 105. His scar is fully healed. He does have residual expected postoperative swelling. At this point he is progressing well with physical therapy and I will like him to progress to work conditioning for the next 6 weeks. Continue modified duty. Follow-up at that time. Anticipate patient being close to being at [place]. He states that he is currently working in a another department at [employer] which she is doing fine with. He is not quite ready for full duty yet.

## 2016-10-01 ENCOUNTER — Telehealth (INDEPENDENT_AMBULATORY_CARE_PROVIDER_SITE_OTHER): Payer: Self-pay

## 2016-10-01 NOTE — Telephone Encounter (Signed)
Faxed the 09/25/16 office note, work note, and PT rx to case mgr per his request and he will get PT set up

## 2016-11-06 ENCOUNTER — Ambulatory Visit (INDEPENDENT_AMBULATORY_CARE_PROVIDER_SITE_OTHER): Payer: Self-pay | Admitting: Orthopaedic Surgery

## 2016-11-07 ENCOUNTER — Encounter (INDEPENDENT_AMBULATORY_CARE_PROVIDER_SITE_OTHER): Payer: Self-pay | Admitting: Orthopaedic Surgery

## 2016-11-07 ENCOUNTER — Ambulatory Visit (INDEPENDENT_AMBULATORY_CARE_PROVIDER_SITE_OTHER): Payer: Worker's Compensation | Admitting: Orthopaedic Surgery

## 2016-11-07 DIAGNOSIS — S82032D Displaced transverse fracture of left patella, subsequent encounter for closed fracture with routine healing: Secondary | ICD-10-CM | POA: Diagnosis not present

## 2016-11-07 NOTE — Progress Notes (Signed)
   Office Visit Note   Patient: Andre Morrison           Date of Birth: May 10, 1960           MRN: [id number] Visit Date: 11/07/2016              Requested by: No referring provider defined for this encounter. PCP: System, Pcp Not In   Assessment & Plan: Visit Diagnoses:  1. Closed displaced transverse fracture of left patella with routine healing, subsequent encounter     Plan: Overall patient is doing well has reached MMI. At this point he can return to full duty. Questions encouraged and answered. Follow-up as needed.  Follow-Up Instructions: Return if symptoms worsen or fail to improve.   Orders:  No orders of the defined types were placed in this encounter.  No orders of the defined types were placed in this encounter.     Procedures: No procedures performed   Clinical Data: No additional findings.   Subjective: Chief Complaint  Patient presents with  . Right Wrist - Pain, Follow-up    Patient is 4 months status post partial patellectomy many. He is doing well and has finished physical therapy. Has no real complaints.    Review of Systems   Objective: Vital Signs: There were no vitals taken for this visit.  Physical Exam  Ortho Exam Left knee exam shows a fully healed surgical scar. Range of motion 0-105. Expected swelling.  Right wrist exam shows good range of motion with slight limitation in wrist extension. Specialty Comments:  No specialty comments available.  Imaging: No results found.   PMFS History: Patient Active Problem List   Diagnosis Date Noted  . Nondisplaced fracture of distal end of right radius 09/25/2016  . Displaced transverse fracture of left patella, subsequent encounter for closed fracture with delayed healing 06/20/2016  . Left patella fracture 06/20/2016  . UMBILICAL HERNIA, HX OF 62/95/2841  . PITUITARY INSUFFICIENCY 10/12/2007  . HYPOGONADISM, MALE 10/12/2007  . SLEEP APNEA 10/12/2007  . DIABETES MELLITUS, TYPE  II 06/07/2007  . HYPERTENSION 06/07/2007  . DEPRESSION 10/06/2006  . OSTEOPOROSIS 10/06/2006   Past Medical History:  Diagnosis Date  . Dysuria   . Elevated blood pressure reading without diagnosis of hypertension   . Other testicular hypofunction   . Type II or unspecified type diabetes mellitus without mention of complication, not stated as uncontrolled     Family History  Problem Relation Age of Onset  . Family history unknown: Yes    No past surgical history on file. Social History   Occupational History  . Not on file.   Social History Main Topics  . Smoking status: Never Smoker  . Smokeless tobacco: Never Used  . Alcohol use Not on file  . Drug use: Unknown  . Sexual activity: Not on file

## 2016-11-13 ENCOUNTER — Telehealth (INDEPENDENT_AMBULATORY_CARE_PROVIDER_SITE_OTHER): Payer: Self-pay | Admitting: Orthopaedic Surgery

## 2016-11-13 NOTE — Telephone Encounter (Signed)
Plan of Care faxed 11/12/16

## 2016-11-14 ENCOUNTER — Telehealth (INDEPENDENT_AMBULATORY_CARE_PROVIDER_SITE_OTHER): Payer: Self-pay | Admitting: *Deleted

## 2016-11-14 NOTE — Telephone Encounter (Signed)
Case manager Gerald Stabs needs office note or anything that states MMI for pt. KLK:[phone]. May have been sent to the wrong fax he stated.

## 2016-11-14 NOTE — Telephone Encounter (Signed)
Faxed office note and work note from 11/07/16

## 2017-01-20 DIAGNOSIS — Z23 Encounter for immunization: Secondary | ICD-10-CM | POA: Diagnosis not present

## 2017-07-31 ENCOUNTER — Telehealth (INDEPENDENT_AMBULATORY_CARE_PROVIDER_SITE_OTHER): Payer: Self-pay

## 2017-07-31 NOTE — Telephone Encounter (Signed)
Mailed completed 25r form with rating of 5% to the leg back to [employer] @ [facility] [phone] F)[phone] [email or web address] [address] [place] [address]

## 2017-10-28 DIAGNOSIS — G4733 Obstructive sleep apnea (adult) (pediatric): Secondary | ICD-10-CM | POA: Diagnosis not present

## 2017-11-23 DIAGNOSIS — Z7251 High risk heterosexual behavior: Secondary | ICD-10-CM | POA: Diagnosis not present

## 2017-11-23 DIAGNOSIS — Z202 Contact with and (suspected) exposure to infections with a predominantly sexual mode of transmission: Secondary | ICD-10-CM | POA: Diagnosis not present

## 2017-12-02 DIAGNOSIS — Z85828 Personal history of other malignant neoplasm of skin: Secondary | ICD-10-CM | POA: Diagnosis not present

## 2017-12-02 DIAGNOSIS — D485 Neoplasm of uncertain behavior of skin: Secondary | ICD-10-CM | POA: Diagnosis not present

## 2017-12-02 DIAGNOSIS — L570 Actinic keratosis: Secondary | ICD-10-CM | POA: Diagnosis not present

## 2017-12-02 DIAGNOSIS — D1801 Hemangioma of skin and subcutaneous tissue: Secondary | ICD-10-CM | POA: Diagnosis not present

## 2017-12-02 DIAGNOSIS — L98499 Non-pressure chronic ulcer of skin of other sites with unspecified severity: Secondary | ICD-10-CM | POA: Diagnosis not present

## 2017-12-02 DIAGNOSIS — C44519 Basal cell carcinoma of skin of other part of trunk: Secondary | ICD-10-CM | POA: Diagnosis not present

## 2017-12-02 DIAGNOSIS — D225 Melanocytic nevi of trunk: Secondary | ICD-10-CM | POA: Diagnosis not present

## 2017-12-02 DIAGNOSIS — C44311 Basal cell carcinoma of skin of nose: Secondary | ICD-10-CM | POA: Diagnosis not present

## 2017-12-11 ENCOUNTER — Ambulatory Visit (HOSPITAL_BASED_OUTPATIENT_CLINIC_OR_DEPARTMENT_OTHER): Payer: BLUE CROSS/BLUE SHIELD | Admitting: Anesthesiology

## 2017-12-11 ENCOUNTER — Encounter (HOSPITAL_BASED_OUTPATIENT_CLINIC_OR_DEPARTMENT_OTHER)
Admission: RE | Disposition: A | Discharge status: Home / Self Care | Payer: Self-pay | Source: Ambulatory Visit | Attending: Urology

## 2017-12-11 ENCOUNTER — Ambulatory Visit (HOSPITAL_BASED_OUTPATIENT_CLINIC_OR_DEPARTMENT_OTHER)
Admission: RE | Admit: 2017-12-11 | Discharge: 2017-12-11 | Disposition: A | Discharge status: Home / Self Care | Payer: BLUE CROSS/BLUE SHIELD | Source: Ambulatory Visit | Attending: Urology | Admitting: Urology

## 2017-12-11 ENCOUNTER — Encounter (HOSPITAL_BASED_OUTPATIENT_CLINIC_OR_DEPARTMENT_OTHER): Payer: Self-pay | Admitting: Anesthesiology

## 2017-12-11 ENCOUNTER — Other Ambulatory Visit: Payer: Self-pay | Admitting: Urology

## 2017-12-11 DIAGNOSIS — Z7982 Long term (current) use of aspirin: Secondary | ICD-10-CM | POA: Diagnosis not present

## 2017-12-11 DIAGNOSIS — Z6834 Body mass index (BMI) 34.0-34.9, adult: Secondary | ICD-10-CM | POA: Diagnosis not present

## 2017-12-11 DIAGNOSIS — Z880 Allergy status to penicillin: Secondary | ICD-10-CM | POA: Insufficient documentation

## 2017-12-11 DIAGNOSIS — Z79890 Hormone replacement therapy (postmenopausal): Secondary | ICD-10-CM | POA: Insufficient documentation

## 2017-12-11 DIAGNOSIS — Z79899 Other long term (current) drug therapy: Secondary | ICD-10-CM | POA: Insufficient documentation

## 2017-12-11 DIAGNOSIS — I10 Essential (primary) hypertension: Secondary | ICD-10-CM | POA: Diagnosis not present

## 2017-12-11 DIAGNOSIS — N201 Calculus of ureter: Secondary | ICD-10-CM | POA: Diagnosis not present

## 2017-12-11 DIAGNOSIS — E119 Type 2 diabetes mellitus without complications: Secondary | ICD-10-CM | POA: Insufficient documentation

## 2017-12-11 DIAGNOSIS — G4730 Sleep apnea, unspecified: Secondary | ICD-10-CM | POA: Diagnosis not present

## 2017-12-11 DIAGNOSIS — Z881 Allergy status to other antibiotic agents status: Secondary | ICD-10-CM | POA: Insufficient documentation

## 2017-12-11 DIAGNOSIS — N132 Hydronephrosis with renal and ureteral calculous obstruction: Secondary | ICD-10-CM | POA: Diagnosis not present

## 2017-12-11 DIAGNOSIS — E669 Obesity, unspecified: Secondary | ICD-10-CM | POA: Diagnosis not present

## 2017-12-11 DIAGNOSIS — Z87442 Personal history of urinary calculi: Secondary | ICD-10-CM | POA: Diagnosis not present

## 2017-12-11 DIAGNOSIS — R109 Unspecified abdominal pain: Secondary | ICD-10-CM | POA: Diagnosis not present

## 2017-12-11 HISTORY — PX: CYSTOSCOPY W/ URETERAL STENT PLACEMENT: SHX1429

## 2017-12-11 LAB — POCT I-STAT, CHEM 8
BUN: 12 mg/dL (ref 6–20)
Calcium, Ion: 1.21 mmol/L (ref 1.15–1.40)
Chloride: 102 mmol/L (ref 98–111)
Creatinine, Ser: 1.6 mg/dL — ABNORMAL HIGH (ref 0.61–1.24)
Glucose, Bld: 127 mg/dL — ABNORMAL HIGH (ref 70–99)
HCT: 41 % (ref 39.0–52.0)
Hemoglobin: 13.9 g/dL (ref 13.0–17.0)
Potassium: 4 mmol/L (ref 3.5–5.1)
SODIUM: 138 mmol/L (ref 135–145)
TCO2: 22 mmol/L (ref 22–32)

## 2017-12-11 SURGERY — CYSTOSCOPY, WITH RETROGRADE PYELOGRAM AND URETERAL STENT INSERTION
Anesthesia: General | Laterality: Bilateral

## 2017-12-11 MED ORDER — CIPROFLOXACIN IN D5W 400 MG/200ML IV SOLN
INTRAVENOUS | Status: AC
  Filled 2017-12-11: qty 200

## 2017-12-11 MED ORDER — ONDANSETRON HCL 4 MG/2ML IJ SOLN
INTRAMUSCULAR | Status: DC | PRN
  Administered 2017-12-11: 4 mg via INTRAVENOUS

## 2017-12-11 MED ORDER — MIDAZOLAM HCL 5 MG/5ML IJ SOLN
INTRAMUSCULAR | Status: DC | PRN
  Administered 2017-12-11: 2 mg via INTRAVENOUS

## 2017-12-11 MED ORDER — LACTATED RINGERS IV SOLN
INTRAVENOUS | Status: DC
  Administered 2017-12-11 (×2): via INTRAVENOUS
  Filled 2017-12-11: qty 1000

## 2017-12-11 MED ORDER — FENTANYL CITRATE (PF) 100 MCG/2ML IJ SOLN
INTRAMUSCULAR | Status: DC | PRN
  Administered 2017-12-11: 50 ug via INTRAVENOUS
  Administered 2017-12-11 (×2): 25 ug via INTRAVENOUS

## 2017-12-11 MED ORDER — STERILE WATER FOR IRRIGATION IR SOLN
Status: DC | PRN
  Administered 2017-12-11: 3000 mL

## 2017-12-11 MED ORDER — FENTANYL CITRATE (PF) 100 MCG/2ML IJ SOLN
INTRAMUSCULAR | Status: AC
  Filled 2017-12-11: qty 2

## 2017-12-11 MED ORDER — ONDANSETRON HCL 4 MG/2ML IJ SOLN
INTRAMUSCULAR | Status: AC
  Filled 2017-12-11: qty 2

## 2017-12-11 MED ORDER — FENTANYL CITRATE (PF) 100 MCG/2ML IJ SOLN
25.0000 ug | INTRAMUSCULAR | Status: DC | PRN
  Filled 2017-12-11: qty 1

## 2017-12-11 MED ORDER — DEXAMETHASONE SODIUM PHOSPHATE 4 MG/ML IJ SOLN
INTRAMUSCULAR | Status: DC | PRN
  Administered 2017-12-11: 10 mg via INTRAVENOUS

## 2017-12-11 MED ORDER — PROMETHAZINE HCL 25 MG/ML IJ SOLN
6.2500 mg | INTRAMUSCULAR | Status: DC | PRN
  Filled 2017-12-11: qty 1

## 2017-12-11 MED ORDER — PROPOFOL 10 MG/ML IV BOLUS
INTRAVENOUS | Status: AC
  Filled 2017-12-11: qty 20

## 2017-12-11 MED ORDER — PROPOFOL 10 MG/ML IV BOLUS
INTRAVENOUS | Status: DC | PRN
  Administered 2017-12-11: 200 mg via INTRAVENOUS

## 2017-12-11 MED ORDER — CIPROFLOXACIN IN D5W 400 MG/200ML IV SOLN
400.0000 mg | INTRAVENOUS | Status: AC
  Administered 2017-12-11: 400 mg via INTRAVENOUS
  Filled 2017-12-11: qty 200

## 2017-12-11 MED ORDER — LIDOCAINE 2% (20 MG/ML) 5 ML SYRINGE
INTRAMUSCULAR | Status: AC
  Filled 2017-12-11: qty 5

## 2017-12-11 MED ORDER — LIDOCAINE HCL (CARDIAC) PF 100 MG/5ML IV SOSY
PREFILLED_SYRINGE | INTRAVENOUS | Status: DC | PRN
  Administered 2017-12-11: 100 mg via INTRAVENOUS

## 2017-12-11 MED ORDER — DEXAMETHASONE SODIUM PHOSPHATE 10 MG/ML IJ SOLN
INTRAMUSCULAR | Status: AC
  Filled 2017-12-11: qty 1

## 2017-12-11 MED ORDER — MIDAZOLAM HCL 2 MG/2ML IJ SOLN
INTRAMUSCULAR | Status: AC
  Filled 2017-12-11: qty 2

## 2017-12-11 SURGICAL SUPPLY — 25 items
BAG DRAIN URO-CYSTO SKYTR STRL (DRAIN) ×3 IMPLANT
CATH INTERMIT  6FR 70CM (CATHETERS) IMPLANT
CATH URET 5FR 28IN CONE TIP (BALLOONS)
CATH URET 5FR 28IN OPEN ENDED (CATHETERS) IMPLANT
CATH URET 5FR 70CM CONE TIP (BALLOONS) IMPLANT
CLOTH BEACON ORANGE TIMEOUT ST (SAFETY) ×3 IMPLANT
GLOVE BIO SURGEON STRL SZ7.5 (GLOVE) ×3 IMPLANT
GOWN STRL REUS W/ TWL XL LVL3 (GOWN DISPOSABLE) ×1 IMPLANT
GOWN STRL REUS W/TWL XL LVL3 (GOWN DISPOSABLE) ×2
GUIDEWIRE 0.038 PTFE COATED (WIRE) IMPLANT
GUIDEWIRE ANG ZIPWIRE 038X150 (WIRE) ×3 IMPLANT
GUIDEWIRE STR DUAL SENSOR (WIRE) ×3 IMPLANT
INFUSOR MANOMETER BAG 3000ML (MISCELLANEOUS) ×3 IMPLANT
KIT BALLIN UROMAX 15FX10 (LABEL) IMPLANT
KIT BALLN UROMAX 15FX4 (MISCELLANEOUS) IMPLANT
KIT BALLN UROMAX 26 75X4 (MISCELLANEOUS)
KIT TURNOVER CYSTO (KITS) ×3 IMPLANT
MANIFOLD NEPTUNE II (INSTRUMENTS) IMPLANT
NS IRRIG 500ML POUR BTL (IV SOLUTION) ×3 IMPLANT
PACK CYSTO (CUSTOM PROCEDURE TRAY) ×3 IMPLANT
SET HIGH PRES BAL DIL (LABEL)
STENT URET 6FRX28 CONTOUR (STENTS) ×6 IMPLANT
TUBE CONNECTING 12'X1/4 (SUCTIONS)
TUBE CONNECTING 12X1/4 (SUCTIONS) IMPLANT
TUBING UROLOGY SET (TUBING) IMPLANT

## 2017-12-11 NOTE — Anesthesia Procedure Notes (Signed)
Procedure Name: LMA Insertion Date/Time: 12/11/2017 2:06 PM Performed by: Justice Rocher, CRNA Pre-anesthesia Checklist: Patient identified, Emergency Drugs available, Suction available and Patient being monitored Patient Re-evaluated:Patient Re-evaluated prior to induction Oxygen Delivery Method: Circle system utilized Preoxygenation: Pre-oxygenation with 100% oxygen Induction Type: IV induction Ventilation: Mask ventilation without difficulty LMA: LMA inserted LMA Size: 5.0 Number of attempts: 1 Airway Equipment and Method: Bite block Placement Confirmation: positive ETCO2 and breath sounds checked- equal and bilateral Tube secured with: Tape Dental Injury: Teeth and Oropharynx as per pre-operative assessment

## 2017-12-11 NOTE — Op Note (Signed)
Preoperative diagnosis: Bilateral ureteral stones Postoperative diagnosis: Bilateral ureteral stones Surgery: Cystoscopy and bilateral retrograde ureterogram's and bilateral insertion of stents Surgeon: Dr. Nicki Reaper Mcdiarmid  The patient has the above diagnosis and consented the above procedure.  Preoperative antibiotics were given.  He had 2 stones in the asymptomatic left ureter and a stone in the right on the symptomatic side  Under cystoscopic and fluoroscopic guidance I easily passed a sensor wire to the mid right ureter.  I then passed a 6 [place] open-ended ureteral catheter at that level and remove the wire.  I did a gentle retrograde with 4 cc of contrast and he had mild dilation of the calyces and mild dilation of the ureter.  A sensor wire was passed to the upper pole calyx.  Over the wire I passed a 22 m x 6 French double-J stent curling in the renal pelvis and in the bladder.  Cystoscopically and fluoroscopically it was in good position.  There was a lot of volcano effect with the stent insertion  The identical procedure was done on the left.  Sensor wire was passed to the mid left ureter.  I had a piggyback it at the level of the ureteral orifice with the open-ended catheter to get a good angle and get by the distal stone.  The retrograde demonstrated mild hydroureter and dilation of calyces.  Wire was then easily placed curling in the upper pole calyx.  Same size stent was easily inserted.  No strings were utilized.  Bladder mucosa was otherwise normal.  No carcinoma or cystitis.  Bladder was emptied and patient was taken to recovery

## 2017-12-11 NOTE — Discharge Instructions (Signed)
I have reviewed discharge instructions in detail with the patient. They will follow-up with me or their physician as scheduled. My nurse will also be calling the patients as per protocol.    Post Anesthesia Home Care Instructions  Activity: Get plenty of rest for the remainder of the day. A responsible individual must stay with you for 24 hours following the procedure.  For the next 24 hours, DO NOT: -Drive a car -Paediatric nurse -Drink alcoholic beverages -Take any medication unless instructed by your physician -Make any legal decisions or sign important papers.  Meals: Start with liquid foods such as gelatin or soup. Progress to regular foods as tolerated. Avoid greasy, spicy, heavy foods. If nausea and/or vomiting occur, drink only clear liquids until the nausea and/or vomiting subsides. Call your physician if vomiting continues.  Special Instructions/Symptoms: Your throat may feel dry or sore from the anesthesia or the breathing tube placed in your throat during surgery. If this causes discomfort, gargle with warm salt water. The discomfort should disappear within 24 hours.  If you had a scopolamine patch placed behind your ear for the management of post- operative nausea and/or vomiting:  1. The medication in the patch is effective for 72 hours, after which it should be removed.  Wrap patch in a tissue and discard in the trash. Wash hands thoroughly with soap and water. 2. You may remove the patch earlier than 72 hours if you experience unpleasant side effects which may include dry mouth, dizziness or visual disturbances. 3. Avoid touching the patch. Wash your hands with soap and water after contact with the patch.     [facility] [phone] Post Ureteroscopy With or Without Stent Instructions  Definitions:  Ureter: The duct that transports urine from the kidney to the bladder. Stent:   A plastic hollow tube that is placed into the ureter, from the kidney  to the bladder to prevent the ureter from swelling shut.  GENERAL INSTRUCTIONS:  Despite the fact that no skin incisions were used, the area around the ureter and bladder is raw and irritated. The stent is a foreign body which will further irritate the bladder wall. This irritation is manifested by increased frequency of urination, both day and night, and by an increase in the urge to urinate. In some, the urge to urinate is present almost always. Sometimes the urge is strong enough that you may not be able to stop yourself from urinating. The only real cure is to remove the stent and then give time for the bladder wall to heal which can't be done until the danger of the ureter swelling shut has passed, which varies.  You may see some blood in your urine while the stent is in place and a few days afterwards. Do not be alarmed, even if the urine was clear for a while. Get off your feet and drink lots of fluids until clearing occurs. If you start to pass clots or don't improve, call us.  DIET: You may return to your normal diet immediately. Because of the raw surface of your bladder, alcohol, spicy foods, acid type foods and drinks with caffeine may cause irritation or frequency and should be used in moderation. To keep your urine flowing freely and to avoid constipation, drink plenty of fluids during the day ( 8-10 glasses ). Tip: Avoid cranberry juice because it is very acidic.  ACTIVITY: Your physical activity doesn't need to be restricted. However, if you are very active, you may see some blood  in your urine. We suggest that you reduce your activity under these circumstances until the bleeding has stopped.  BOWELS: It is important to keep your bowels regular during the postoperative period. Straining with bowel movements can cause bleeding. A bowel movement every other day is reasonable. Use a mild laxative if needed, such as Milk of Magnesia 2-3 tablespoons, or 2 Dulcolax tablets. Call if you  continue to have problems. If you have been taking narcotics for pain, before, during or after your surgery, you may be constipated. Take a laxative if necessary.   MEDICATION: You should resume your pre-surgery medications unless told not to. In addition you will often be given an antibiotic to prevent infection. These should be taken as prescribed until the bottles are finished unless you are having an unusual reaction to one of the drugs.  PROBLEMS YOU SHOULD REPORT TO [place]:  Fevers over 100.5 Fahrenheit.  Heavy bleeding, or clots ( See above notes about blood in urine ).  Inability to urinate.  Drug reactions ( hives, rash, nausea, vomiting, diarrhea ).  Severe burning or pain with urination that is not improving.  FOLLOW-UP: You will need a follow-up appointment to monitor your progress. Call for this appointment at the number listed above. Usually the first appointment will be about three to fourteen days after your surgery.

## 2017-12-11 NOTE — Anesthesia Preprocedure Evaluation (Addendum)
Anesthesia Evaluation  Patient identified by MRN, date of birth, ID band Patient awake    Reviewed: Allergy & Precautions, NPO status , Patient's Chart, lab work & pertinent test results  Airway Mallampati: II  TM Distance: >3 FB Neck ROM: Full    Dental  (+) Teeth Intact, Dental Advisory Given   Pulmonary neg pulmonary ROS, sleep apnea ,    Pulmonary exam normal breath sounds clear to auscultation       Cardiovascular hypertension, Pt. on medications Normal cardiovascular exam Rhythm:Regular Rate:Normal     Neuro/Psych PSYCHIATRIC DISORDERS Depression negative neurological ROS     GI/Hepatic negative GI ROS, Neg liver ROS,   Endo/Other  diabetes, Type 2Obesity   Renal/GU negative Renal ROS     Musculoskeletal negative musculoskeletal ROS (+)   Abdominal   Peds  Hematology negative hematology ROS (+)   Anesthesia Other Findings Day of surgery medications reviewed with the patient.  Reproductive/Obstetrics                            Anesthesia Physical Anesthesia Plan  ASA: III  Anesthesia Plan: General   Post-op Pain Management:    Induction: Intravenous  PONV Risk Score and Plan: 4 or greater and Midazolam, Dexamethasone and Ondansetron  Airway Management Planned: LMA  Additional Equipment:   Intra-op Plan:   Post-operative Plan: Extubation in OR  Informed Consent: I have reviewed the patients History and Physical, chart, labs and discussed the procedure including the risks, benefits and alternatives for the proposed anesthesia with the patient or authorized representative who has indicated his/her understanding and acceptance.   Dental advisory given  Plan Discussed with: CRNA  Anesthesia Plan Comments:         Anesthesia Quick Evaluation

## 2017-12-11 NOTE — H&P (Signed)
Patient was brought in today with right flank pain in the last 24 hours. He has had some pain off and on since early August. His last lithotripsy was in 2009/08/08. I do not think he has ever had ureteroscopy. He is on baby aspirin. He does not take blood thinners. He does not get bladder infections.   He voids every few hours with a good flow. He has not had nausea vomiting fever blood or increased frequency   There is no other aggravating or relieving factors  There is no other associated signs and symptoms  The severity of the symptoms is moderate  The symptoms are ongoing and bothersome      ALLERGIES: Penicillins Tetracyclines    MEDICATIONS: Actos 30 MG Oral Tablet Oral  Clomid TABS Oral  Furosemide 20 mg tablet Oral  Janumet 50-1000 MG Oral Tablet Oral     GU PSH: ESWL - 2009/08/08 Rpr Umbil Hern; Reduc < 5 Yr August 08, 2009      PSH Notes: Lithotripsy, Umbilical Hernia Repair   NON-GU PSH: No Non-GU PSH        GU PMH: BPH w/o LUTS, Benign prostatic hypertrophy without lower urinary tract symptoms - Aug 08, 2012 History of urolithiasis, Nephrolithiasis - 08-Aug-2012 Ureteral calculus, Calculus of ureter - August 08, 2012      PMH Notes:  1898-04-21 00:00:00 - Note: Normal Routine History And Physical Adult   NON-GU PMH: Personal history of other diseases of the nervous system and sense organs, History of sleep apnea - 2012/08/08 Personal history of other endocrine, nutritional and metabolic disease, History of diabetes mellitus - 2012/08/08 Personal history of other mental and behavioral disorders, History of depression - 08/08/12    FAMILY HISTORY: Death In The Family Father - Runs In Family Family Health Status - Mother's Age - Runs In Family Family Health Status Number - Runs In Family   SOCIAL HISTORY: No Social History     Notes: Caffeine Use, Marital History - Currently Married, Alcohol Use, Tobacco Use   REVIEW OF SYSTEMS:     GU Review Male:  Patient denies frequent urination, hard to postpone urination, burning/ pain  with urination, get up at night to urinate, leakage of urine, stream starts and stops, trouble starting your stream, have to strain to urinate , erection problems, and penile pain.    Gastrointestinal (Upper):  Patient denies nausea, vomiting, and indigestion/ heartburn.    Gastrointestinal (Lower):  Patient denies diarrhea and constipation.    Constitutional:  Patient denies fever, night sweats, weight loss, and fatigue.    Skin:  Patient denies skin rash/ lesion and itching.    Eyes:  Patient denies blurred vision and double vision.    Ears/ Nose/ Throat:  Patient denies sore throat and sinus problems.    Hematologic/Lymphatic:  Patient denies swollen glands and easy bruising.    Cardiovascular:  Patient denies leg swelling and chest pains.    Respiratory:  Patient denies cough and shortness of breath.    Endocrine:  Patient denies excessive thirst.    Musculoskeletal:  Patient denies back pain and joint pain.    Neurological:  Patient denies headaches and dizziness.    Psychologic:  Patient denies depression and anxiety.    VITAL SIGNS:       12/11/2017 11:28 AM     Weight 268 lb / 121.56 kg     Height 76.5 in / 194.31 cm     BP 144/76 mmHg     Pulse 54 /min  Temperature 97.6 F / 36.4 C     BMI 32.2 kg/m     GU PHYSICAL EXAMINATION:      Penis: Nontoxic. Minimal to no CVA tenderness. No abdominal tenderness. Genitalia normal     MULTI-SYSTEM PHYSICAL EXAMINATION:      Constitutional: Well-nourished. No physical deformities. Normally developed. Good grooming.     Neck: Neck symmetrical, not swollen. Normal tracheal position.     Respiratory: No labored breathing, no use of accessory muscles.      Cardiovascular: Normal temperature, normal extremity pulses, no swelling, no varicosities.     Lymphatic: No enlargement of neck, axillae, groin.     Skin: No paleness, no jaundice, no cyanosis. No lesion, no ulcer, no rash.     Neurologic / Psychiatric: Oriented to time, oriented to  place, oriented to person. No depression, no anxiety, no agitation.     Gastrointestinal: No mass, no tenderness, no rigidity, non obese abdomen.     Eyes: Normal conjunctivae. Normal eyelids.     Ears, Nose, Mouth, and Throat: Left ear no scars, no lesions, no masses. Right ear no scars, no lesions, no masses. Nose no scars, no lesions, no masses. Normal hearing. Normal lips.     Musculoskeletal: Normal gait and station of head and neck.            PAST DATA REVIEWED:   Source Of History:  Patient   PROCEDURES:    C.T. Urogram - [id number]          Ketoralac 60mg  - [id number], [email or web address]  Qty: 60 Adm. By: Parks Ranger  Unit: mg Lot No [id number]  Route: IM Exp. Date 04/21/2018  Freq: None Mfgr.:   Site: Left Buttock  Phenergan 25mg  - [id number], [id number]  Qty: 25 Adm. By: Parks Ranger  Unit: mg Lot No [id number]  Route: IM Exp. Date 01/20/2019  Freq: None Mfgr.:   Site: Left Buttock   ASSESSMENT:     ICD-10 Details  1 GU:  Ureteral calculus - N20.1 Stable   Notes:  Patient was brought in today with right flank pain in the last 24 hours. He has had some pain off and on since early August. His last lithotripsy was in 2011. I do not think he has ever had ureteroscopy. He is on baby aspirin. He does not take blood thinners. He does not get bladder infections.   He voids every few hours with a good flow. He has not had nausea vomiting fever blood or increased frequency   Patient has right flank pain. She has an extensive stone past history.   The patient had a CT scan. He had mild-to-moderate hydronephrosis on the right down to the level of approximately a 5 x 4 mm stone just beyond the iliac vessels in the pelvis. I felt he had 2 stones in the left ureter with mild hydro ureter. He has approximately a 2 mm stone just beyond the left ureteral pelvic junction. He had around 5 mm stone at the left ureterovesical junction.   Picture drawn. Patient's right flank pain acutely worsened while in the office. he  understands how stones are generally managed medically. We talked about the urgency of having bilateral ureteral stones and only in rare cases we have patients try to pass them. I went over in details the pros cons and risks of bilateral stents and sequelae including injury and needing for percutaneous tubes. We talked about failure rate. We talked about stent symptoms. I gave him 60 mg of  intramuscular Toradol and 25 mg of Phenergan. He drinks some clear fluid CIS morning but has not eaten since last night. We will proceed with bilateral stents in in the future he would need bilateral ureteroscopy. He does know Dr. Zettie Pho father very well. We will proceed accordingly    After a thorough review of the management options for the patient's condition the patient  elected to proceed with surgical therapy as noted above. We have discussed the potential benefits and risks of the procedure, side effects of the proposed treatment, the likelihood of the patient achieving the goals of the procedure, and any potential problems that might occur during the procedure or recuperation. Informed consent has been obtained.

## 2017-12-11 NOTE — Transfer of Care (Signed)
Immediate Anesthesia Transfer of Care Note  Patient: Andre Morrison  Procedure(s) Performed: Procedure(s) (LRB): CYSTOSCOPY WITH RETROGRADE PYELOGRAM/URETERAL STENT PLACEMENT (Bilateral)  Patient Location: PACU  Anesthesia Type: General  Level of Consciousness: awake, sedated, patient cooperative and responds to stimulation  Airway & Oxygen Therapy: Patient Spontanous Breathing and Patient connected to [place] o2  Post-op Assessment: Report given to PACU RN, Post -op Vital signs reviewed and stable and Patient moving all extremities  Post vital signs: Reviewed and stable  Complications: No apparent anesthesia complications

## 2017-12-11 NOTE — Interval H&P Note (Signed)
History and Physical Interval Note:  12/11/2017 12:48 PM  Andre Morrison  has presented today for surgery, with the diagnosis of BILATERAL URETERAL STONES  The various methods of treatment have been discussed with the patient and family. After consideration of risks, benefits and other options for treatment, the patient has consented to  Procedure(s): CYSTOSCOPY WITH RETROGRADE PYELOGRAM/URETERAL STENT PLACEMENT (Bilateral) as a surgical intervention .  The patient's history has been reviewed, patient examined, no change in status, stable for surgery.  I have reviewed the patient's chart and labs.  Questions were answered to the patient's satisfaction.     [name]

## 2017-12-14 ENCOUNTER — Encounter (HOSPITAL_BASED_OUTPATIENT_CLINIC_OR_DEPARTMENT_OTHER): Payer: Self-pay | Admitting: Urology

## 2017-12-14 DIAGNOSIS — E1169 Type 2 diabetes mellitus with other specified complication: Secondary | ICD-10-CM | POA: Diagnosis not present

## 2017-12-14 DIAGNOSIS — I10 Essential (primary) hypertension: Secondary | ICD-10-CM | POA: Diagnosis not present

## 2017-12-14 DIAGNOSIS — E291 Testicular hypofunction: Secondary | ICD-10-CM | POA: Diagnosis not present

## 2017-12-14 DIAGNOSIS — Z87442 Personal history of urinary calculi: Secondary | ICD-10-CM | POA: Diagnosis not present

## 2017-12-14 DIAGNOSIS — Z23 Encounter for immunization: Secondary | ICD-10-CM | POA: Diagnosis not present

## 2017-12-14 NOTE — Anesthesia Postprocedure Evaluation (Signed)
Anesthesia Post Note  Patient: Andre Morrison  Procedure(s) Performed: CYSTOSCOPY WITH RETROGRADE PYELOGRAM/URETERAL STENT PLACEMENT (Bilateral )     Patient location during evaluation: PACU Anesthesia Type: General Level of consciousness: awake and alert Pain management: pain level controlled Vital Signs Assessment: post-procedure vital signs reviewed and stable Respiratory status: spontaneous breathing, nonlabored ventilation, respiratory function stable and patient connected to nasal cannula oxygen Cardiovascular status: blood pressure returned to baseline and stable Postop Assessment: no apparent nausea or vomiting Anesthetic complications: no    Last Vitals:  Vitals:   12/11/17 1600 12/11/17 1645  BP: 118/65 130/71  Pulse: (!) 54 (!) 46  Resp: 14 12  Temp:  36.8 C  SpO2: 100% 100%    Last Pain:  Vitals:   12/11/17 1645  TempSrc:   PainSc: 0-No pain                 Effie Berkshire

## 2017-12-15 DIAGNOSIS — R609 Edema, unspecified: Secondary | ICD-10-CM | POA: Diagnosis not present

## 2017-12-15 DIAGNOSIS — L97329 Non-pressure chronic ulcer of left ankle with unspecified severity: Secondary | ICD-10-CM | POA: Diagnosis not present

## 2017-12-15 DIAGNOSIS — I83023 Varicose veins of left lower extremity with ulcer of ankle: Secondary | ICD-10-CM | POA: Diagnosis not present

## 2017-12-15 DIAGNOSIS — M79672 Pain in left foot: Secondary | ICD-10-CM | POA: Diagnosis not present

## 2017-12-17 DIAGNOSIS — N130 Hydronephrosis with ureteropelvic junction obstruction: Secondary | ICD-10-CM | POA: Diagnosis not present

## 2017-12-18 DIAGNOSIS — N201 Calculus of ureter: Secondary | ICD-10-CM | POA: Diagnosis not present

## 2017-12-22 DIAGNOSIS — L97329 Non-pressure chronic ulcer of left ankle with unspecified severity: Secondary | ICD-10-CM | POA: Diagnosis not present

## 2017-12-22 DIAGNOSIS — M79672 Pain in left foot: Secondary | ICD-10-CM | POA: Diagnosis not present

## 2017-12-22 DIAGNOSIS — I83023 Varicose veins of left lower extremity with ulcer of ankle: Secondary | ICD-10-CM | POA: Diagnosis not present

## 2017-12-23 ENCOUNTER — Other Ambulatory Visit: Payer: Self-pay | Admitting: Urology

## 2017-12-31 ENCOUNTER — Encounter (HOSPITAL_BASED_OUTPATIENT_CLINIC_OR_DEPARTMENT_OTHER): Payer: Self-pay | Admitting: *Deleted

## 2018-01-01 ENCOUNTER — Telehealth (INDEPENDENT_AMBULATORY_CARE_PROVIDER_SITE_OTHER): Payer: Self-pay

## 2018-01-01 NOTE — Telephone Encounter (Signed)
Mailed completed 25r form to [employer] attn: Hyman Bower. [place] [id number] Tried emailing to adj Hyman [email or web address] @ [email or web address] and it came back as undeliverable. Rating marked was20% to the leg

## 2018-01-04 ENCOUNTER — Other Ambulatory Visit: Payer: Self-pay

## 2018-01-04 ENCOUNTER — Encounter (HOSPITAL_BASED_OUTPATIENT_CLINIC_OR_DEPARTMENT_OTHER): Payer: Self-pay

## 2018-01-04 NOTE — Progress Notes (Signed)
Spoke with:  Andre Morrison:  After Midnight, no gum, candy, or mints  Arrival time: 0930AM Labs: Istat4, EKG AM medications: Loratadine Pre op orders:  Yes Ride home:  Legrand Como (son) [phone] Asked to bring CPAP

## 2018-01-05 DIAGNOSIS — E291 Testicular hypofunction: Secondary | ICD-10-CM | POA: Diagnosis not present

## 2018-01-08 ENCOUNTER — Encounter (HOSPITAL_BASED_OUTPATIENT_CLINIC_OR_DEPARTMENT_OTHER)
Admission: RE | Disposition: A | Discharge status: Home / Self Care | Payer: Self-pay | Source: Ambulatory Visit | Attending: Urology

## 2018-01-08 ENCOUNTER — Encounter (HOSPITAL_BASED_OUTPATIENT_CLINIC_OR_DEPARTMENT_OTHER): Payer: Self-pay | Admitting: *Deleted

## 2018-01-08 ENCOUNTER — Ambulatory Visit (HOSPITAL_BASED_OUTPATIENT_CLINIC_OR_DEPARTMENT_OTHER): Payer: BLUE CROSS/BLUE SHIELD | Admitting: Anesthesiology

## 2018-01-08 ENCOUNTER — Ambulatory Visit (HOSPITAL_BASED_OUTPATIENT_CLINIC_OR_DEPARTMENT_OTHER)
Admission: RE | Admit: 2018-01-08 | Discharge: 2018-01-08 | Disposition: A | Discharge status: Home / Self Care | Payer: BLUE CROSS/BLUE SHIELD | Source: Ambulatory Visit | Attending: Urology | Admitting: Urology

## 2018-01-08 DIAGNOSIS — Z87442 Personal history of urinary calculi: Secondary | ICD-10-CM | POA: Diagnosis not present

## 2018-01-08 DIAGNOSIS — R7303 Prediabetes: Secondary | ICD-10-CM | POA: Diagnosis not present

## 2018-01-08 DIAGNOSIS — N132 Hydronephrosis with renal and ureteral calculous obstruction: Secondary | ICD-10-CM | POA: Insufficient documentation

## 2018-01-08 DIAGNOSIS — F329 Major depressive disorder, single episode, unspecified: Secondary | ICD-10-CM | POA: Insufficient documentation

## 2018-01-08 DIAGNOSIS — Z6831 Body mass index (BMI) 31.0-31.9, adult: Secondary | ICD-10-CM | POA: Diagnosis not present

## 2018-01-08 DIAGNOSIS — G4733 Obstructive sleep apnea (adult) (pediatric): Secondary | ICD-10-CM | POA: Diagnosis not present

## 2018-01-08 DIAGNOSIS — E230 Hypopituitarism: Secondary | ICD-10-CM | POA: Diagnosis not present

## 2018-01-08 DIAGNOSIS — Z880 Allergy status to penicillin: Secondary | ICD-10-CM | POA: Insufficient documentation

## 2018-01-08 DIAGNOSIS — Z79899 Other long term (current) drug therapy: Secondary | ICD-10-CM | POA: Diagnosis not present

## 2018-01-08 DIAGNOSIS — N201 Calculus of ureter: Secondary | ICD-10-CM | POA: Diagnosis not present

## 2018-01-08 DIAGNOSIS — I10 Essential (primary) hypertension: Secondary | ICD-10-CM | POA: Diagnosis not present

## 2018-01-08 DIAGNOSIS — N400 Enlarged prostate without lower urinary tract symptoms: Secondary | ICD-10-CM | POA: Insufficient documentation

## 2018-01-08 DIAGNOSIS — E669 Obesity, unspecified: Secondary | ICD-10-CM | POA: Diagnosis not present

## 2018-01-08 DIAGNOSIS — Z881 Allergy status to other antibiotic agents status: Secondary | ICD-10-CM | POA: Insufficient documentation

## 2018-01-08 DIAGNOSIS — Z85828 Personal history of other malignant neoplasm of skin: Secondary | ICD-10-CM | POA: Diagnosis not present

## 2018-01-08 HISTORY — DX: Umbilical hernia without obstruction or gangrene: K42.9

## 2018-01-08 HISTORY — DX: Hypopituitarism: E23.0

## 2018-01-08 HISTORY — DX: Testicular hypofunction: E29.1

## 2018-01-08 HISTORY — DX: Essential (primary) hypertension: I10

## 2018-01-08 HISTORY — DX: Prediabetes: R73.03

## 2018-01-08 HISTORY — DX: Presence of spectacles and contact lenses: Z97.3

## 2018-01-08 HISTORY — DX: Basal cell carcinoma of skin, unspecified: C44.91

## 2018-01-08 HISTORY — DX: Personal history of urinary calculi: Z87.442

## 2018-01-08 HISTORY — DX: Obstructive sleep apnea (adult) (pediatric): Z99.89

## 2018-01-08 HISTORY — DX: Obstructive sleep apnea (adult) (pediatric): G47.33

## 2018-01-08 HISTORY — PX: CYSTOSCOPY/URETEROSCOPY/HOLMIUM LASER/STENT PLACEMENT: SHX6546

## 2018-01-08 LAB — POCT I-STAT 4, (NA,K, GLUC, HGB,HCT)
GLUCOSE: 139 mg/dL — AB (ref 70–99)
HEMATOCRIT: 38 % — AB (ref 39.0–52.0)
Hemoglobin: 12.9 g/dL — ABNORMAL LOW (ref 13.0–17.0)
POTASSIUM: 4.4 mmol/L (ref 3.5–5.1)
SODIUM: 142 mmol/L (ref 135–145)

## 2018-01-08 SURGERY — CYSTOSCOPY/URETEROSCOPY/HOLMIUM LASER/STENT PLACEMENT
Anesthesia: General | Laterality: Bilateral

## 2018-01-08 MED ORDER — ONDANSETRON HCL 4 MG/2ML IJ SOLN
INTRAMUSCULAR | Status: DC | PRN
  Administered 2018-01-08: 4 mg via INTRAVENOUS

## 2018-01-08 MED ORDER — CIPROFLOXACIN IN D5W 400 MG/200ML IV SOLN
INTRAVENOUS | Status: AC
  Filled 2018-01-08: qty 200

## 2018-01-08 MED ORDER — EPHEDRINE SULFATE-NACL 50-0.9 MG/10ML-% IV SOSY
PREFILLED_SYRINGE | INTRAVENOUS | Status: DC | PRN
  Administered 2018-01-08: 10 mg via INTRAVENOUS
  Administered 2018-01-08: 5 mg via INTRAVENOUS

## 2018-01-08 MED ORDER — KETOROLAC TROMETHAMINE 30 MG/ML IJ SOLN
INTRAMUSCULAR | Status: AC
  Filled 2018-01-08: qty 1

## 2018-01-08 MED ORDER — MEPERIDINE HCL 25 MG/ML IJ SOLN
6.2500 mg | INTRAMUSCULAR | Status: DC | PRN
  Filled 2018-01-08: qty 1

## 2018-01-08 MED ORDER — MIDAZOLAM HCL 2 MG/2ML IJ SOLN
INTRAMUSCULAR | Status: DC | PRN
  Administered 2018-01-08: 2 mg via INTRAVENOUS

## 2018-01-08 MED ORDER — FENTANYL CITRATE (PF) 100 MCG/2ML IJ SOLN
INTRAMUSCULAR | Status: AC
  Filled 2018-01-08: qty 2

## 2018-01-08 MED ORDER — LIDOCAINE 2% (20 MG/ML) 5 ML SYRINGE
INTRAMUSCULAR | Status: DC | PRN
  Administered 2018-01-08: 100 mg via INTRAVENOUS

## 2018-01-08 MED ORDER — HYDROCODONE-ACETAMINOPHEN 5-325 MG PO TABS: 1.0000 | ORAL_TABLET | ORAL | 0 refills | Status: DC | PRN

## 2018-01-08 MED ORDER — LIDOCAINE 2% (20 MG/ML) 5 ML SYRINGE
INTRAMUSCULAR | Status: AC
  Filled 2018-01-08: qty 5

## 2018-01-08 MED ORDER — CIPROFLOXACIN HCL 500 MG PO TABS: 500.0000 mg | ORAL_TABLET | Freq: Two times a day (BID) | ORAL | 0 refills | Status: AC

## 2018-01-08 MED ORDER — MIDAZOLAM HCL 2 MG/2ML IJ SOLN
INTRAMUSCULAR | Status: AC
  Filled 2018-01-08: qty 2

## 2018-01-08 MED ORDER — LACTATED RINGERS IV SOLN
INTRAVENOUS | Status: DC
  Administered 2018-01-08: 14:00:00 via INTRAVENOUS
  Filled 2018-01-08: qty 1000

## 2018-01-08 MED ORDER — FENTANYL CITRATE (PF) 100 MCG/2ML IJ SOLN
25.0000 ug | INTRAMUSCULAR | Status: DC | PRN
  Filled 2018-01-08: qty 1

## 2018-01-08 MED ORDER — PROPOFOL 500 MG/50ML IV EMUL
INTRAVENOUS | Status: AC
  Filled 2018-01-08: qty 50

## 2018-01-08 MED ORDER — LACTATED RINGERS IV SOLN
INTRAVENOUS | Status: DC
  Administered 2018-01-08: 10:00:00 via INTRAVENOUS
  Filled 2018-01-08: qty 1000

## 2018-01-08 MED ORDER — KETOROLAC TROMETHAMINE 30 MG/ML IJ SOLN
INTRAMUSCULAR | Status: DC | PRN
  Administered 2018-01-08: 30 mg via INTRAVENOUS

## 2018-01-08 MED ORDER — METOCLOPRAMIDE HCL 5 MG/ML IJ SOLN
10.0000 mg | Freq: Once | INTRAMUSCULAR | Status: DC | PRN
  Filled 2018-01-08: qty 2

## 2018-01-08 MED ORDER — ONDANSETRON HCL 4 MG PO TABS: 4.0000 mg | ORAL_TABLET | Freq: Every day | ORAL | 1 refills | Status: DC | PRN

## 2018-01-08 MED ORDER — DEXAMETHASONE SODIUM PHOSPHATE 10 MG/ML IJ SOLN
INTRAMUSCULAR | Status: DC | PRN
  Administered 2018-01-08: 10 mg via INTRAVENOUS

## 2018-01-08 MED ORDER — ONDANSETRON HCL 4 MG/2ML IJ SOLN
INTRAMUSCULAR | Status: AC
  Filled 2018-01-08: qty 2

## 2018-01-08 MED ORDER — PROPOFOL 10 MG/ML IV BOLUS
INTRAVENOUS | Status: DC | PRN
  Administered 2018-01-08: 200 mg via INTRAVENOUS

## 2018-01-08 MED ORDER — CIPROFLOXACIN IN D5W 400 MG/200ML IV SOLN
400.0000 mg | Freq: Once | INTRAVENOUS | Status: AC
  Administered 2018-01-08: 400 mg via INTRAVENOUS
  Filled 2018-01-08: qty 200

## 2018-01-08 MED ORDER — SODIUM CHLORIDE 0.9 % IR SOLN
Status: DC | PRN
  Administered 2018-01-08 (×2): 3000 mL

## 2018-01-08 MED ORDER — FENTANYL CITRATE (PF) 100 MCG/2ML IJ SOLN
INTRAMUSCULAR | Status: DC | PRN
  Administered 2018-01-08 (×2): 50 ug via INTRAVENOUS

## 2018-01-08 MED ORDER — DEXAMETHASONE SODIUM PHOSPHATE 10 MG/ML IJ SOLN
INTRAMUSCULAR | Status: AC
  Filled 2018-01-08: qty 1

## 2018-01-08 SURGICAL SUPPLY — 24 items
BAG DRAIN URO-CYSTO SKYTR STRL (DRAIN) ×2 IMPLANT
BASKET STONE 1.7 NGAGE (UROLOGICAL SUPPLIES) IMPLANT
BASKET ZERO TIP NITINOL 2.4FR (BASKET) ×2 IMPLANT
BENZOIN TINCTURE PRP APPL 2/3 (GAUZE/BANDAGES/DRESSINGS) IMPLANT
CATH URET 5FR 28IN OPEN ENDED (CATHETERS) ×2 IMPLANT
CLOTH BEACON ORANGE TIMEOUT ST (SAFETY) ×2 IMPLANT
FIBER LASER FLEXIVA 365 (UROLOGICAL SUPPLIES) IMPLANT
FIBER LASER TRAC TIP (UROLOGICAL SUPPLIES) ×2 IMPLANT
GLOVE BIO SURGEON STRL SZ7.5 (GLOVE) ×2 IMPLANT
GOWN STRL REUS W/TWL XL LVL3 (GOWN DISPOSABLE) ×2 IMPLANT
GUIDEWIRE STR DUAL SENSOR (WIRE) IMPLANT
GUIDEWIRE ZIPWRE .038 STRAIGHT (WIRE) ×2 IMPLANT
IV NS 1000ML (IV SOLUTION)
IV NS 1000ML BAXH (IV SOLUTION) IMPLANT
IV NS IRRIG 3000ML ARTHROMATIC (IV SOLUTION) ×4 IMPLANT
KIT TURNOVER CYSTO (KITS) ×2 IMPLANT
MANIFOLD NEPTUNE II (INSTRUMENTS) ×2 IMPLANT
NS IRRIG 500ML POUR BTL (IV SOLUTION) ×4 IMPLANT
PACK CYSTO (CUSTOM PROCEDURE TRAY) ×2 IMPLANT
STENT URET 6FRX28 CONTOUR (STENTS) ×4 IMPLANT
STRIP CLOSURE SKIN 1/2X4 (GAUZE/BANDAGES/DRESSINGS) IMPLANT
SYR 10ML LL (SYRINGE) ×2 IMPLANT
TUBE CONNECTING 12X1/4 (SUCTIONS) IMPLANT
TUBING UROLOGY SET (TUBING) ×2 IMPLANT

## 2018-01-08 NOTE — Anesthesia Postprocedure Evaluation (Signed)
Anesthesia Post Note  Patient: Andre Morrison  Procedure(s) Performed: CYSTOSCOPY/URETEROSCOPY/HOLMIUM LASER/STENT REMOVAL AND STENT PLACEMENT, STONE BASKET EXTRACTION (Bilateral )     Patient location during evaluation: PACU Anesthesia Type: General Level of consciousness: sedated Pain management: pain level controlled Vital Signs Assessment: post-procedure vital signs reviewed and stable Respiratory status: spontaneous breathing and respiratory function stable Cardiovascular status: stable Postop Assessment: no apparent nausea or vomiting Anesthetic complications: no    Last Vitals:  Vitals:   01/08/18 1345 01/08/18 1349  BP: 118/62 118/62  Pulse: 62 61  Resp: 11 10  Temp:    SpO2:  97%    Last Pain:  Vitals:   01/08/18 1415  TempSrc:   PainSc: 0-No pain                 Opaline Reyburn DANIEL

## 2018-01-08 NOTE — Anesthesia Procedure Notes (Signed)
Procedure Name: LMA Insertion Date/Time: 01/08/2018 11:40 AM Performed by: Wanita Chamberlain, CRNA Pre-anesthesia Checklist: Patient identified, Timeout performed, Emergency Drugs available, Suction available and Patient being monitored Patient Re-evaluated:Patient Re-evaluated prior to induction Oxygen Delivery Method: Circle system utilized Preoxygenation: Pre-oxygenation with 100% oxygen Induction Type: IV induction Ventilation: Mask ventilation without difficulty LMA: LMA inserted LMA Size: 5.0 Number of attempts: 1 Placement Confirmation: CO2 detector,  positive ETCO2 and breath sounds checked- equal and bilateral Tube secured with: Tape Dental Injury: Teeth and Oropharynx as per pre-operative assessment

## 2018-01-08 NOTE — H&P (Signed)
Urology Preoperative H&P   Chief Complaint: Kidney stones  History of Present Illness: Andre Morrison is a 57 y.o. male with a long history of stones who is s/p bilateral JJ stent placement with Dr. Matilde Sprang on 12/11/17 after he was found to have bilateral ureteral stones.   He is doing well after bilateral stent placement. His pain is improved, but does note mild discomfort in both flanks when voiding. He also has occasional urgency and frequency, but denies N/V/F/C, dysuria or hematuria. Currently off aspirin.   His last stone episode was in 2011 and required ESWL.   CTSS 12/11/2017  IMPRESSION:  1. Upper pelvic segment 5 mm right ureteral stone with mild right hydroureteronephrosis.  2. Distal pelvic segment 6 mm left ureteral stone and proximal lumbar segment 3 mm left ureteral stone with mild left hydroureteronephrosis.  3. Mildly enlarged prostate.     Past Medical History:  Diagnosis Date  . Basal cell carcinoma    Nose  . Dysuria   . History of kidney stones   . Hypertension   . Hypogonadism in male   . OSA on CPAP   . Pituitary insufficiency ([place])   . Pre-diabetes   . Umbilical hernia 72/12/4707  . Wears glasses     Past Surgical History:  Procedure Laterality Date  . COLONOSCOPY     x2  . CYSTOSCOPY W/ URETERAL STENT PLACEMENT Bilateral 12/11/2017   Procedure: CYSTOSCOPY WITH RETROGRADE PYELOGRAM/URETERAL STENT PLACEMENT;  Surgeon: Bjorn Loser, MD;  Location: [facility];  Service: Urology;  Laterality: Bilateral;  . EXTRACORPOREAL SHOCK WAVE LITHOTRIPSY Left 04-30-2009    dr Terance Hart  . PATELLECTOMY Left 06-27-2016   dr Erlinda Hong   partial  . UMBILICAL HERNIA REPAIR  07-12-2003   dr Bubba Camp   incarcerated  . VARICOSE VEIN SURGERY Left 1976    Allergies:  Allergies  Allergen Reactions  . Penicillins     REACTION: unspecified  . Tetracycline Other (See Comments)    Hair grows on tongue  . Penicillin G Rash    Family History  Family  history unknown: Yes    Social History:  reports that he has never smoked. He has never used smokeless tobacco. He reports that he drinks about 1.0 standard drinks of alcohol per week. He reports that he does not use drugs.  ROS: A complete review of systems was performed.  All systems are negative except for pertinent findings as noted.  Physical Exam:  Vital signs in last 24 hours: Temp:  [97.6 F (36.4 C)] 97.6 F (36.4 C) (09/20 0926) Pulse Rate:  [44] 44 (09/20 0926) Resp:  [16] 16 (09/20 0926) BP: (143)/(90) 143/90 (09/20 0926) SpO2:  [97 %] 97 % (09/20 0926) Weight:  [121.8 kg] 121.8 kg (09/20 0926) Constitutional:  Alert and oriented, No acute distress Cardiovascular: Regular rate and rhythm, No JVD Respiratory: Normal respiratory effort, Lungs clear bilaterally GI: Abdomen is soft, nontender, nondistended, no abdominal masses GU: No CVA tenderness Lymphatic: No lymphadenopathy Neurologic: Grossly intact, no focal deficits Psychiatric: Normal mood and affect  Laboratory Data:  Recent Labs    01/08/18 1014  HGB 12.9*  HCT 38.0*    Recent Labs    01/08/18 1014  NA 142  K 4.4  GLUCOSE 139*     Results for orders placed or performed during the hospital encounter of 01/08/18 (from the past 24 hour(s))  I-STAT 4, (NA,K, GLUC, HGB,HCT)     Status: Abnormal   Collection Time: 01/08/18 10:14  AM  Result Value Ref Range   Sodium 142 135 - 145 mmol/L   Potassium 4.4 3.5 - 5.1 mmol/L   Glucose, Bld 139 (H) 70 - 99 mg/dL   HCT 38.0 (L) 39.0 - 52.0 %   Hemoglobin 12.9 (L) 13.0 - 17.0 g/dL   No results found for this or any previous visit (from the past 240 hour(s)).  Renal Function: No results for input(s): CREATININE in the last 168 hours. CrCl cannot be calculated (Patient's most recent lab result is older than the maximum 21 days allowed.).  Radiologic Imaging: No results found.  I independently reviewed the above imaging studies.  Assessment and  Plan Andre Morrison is a 57 y.o. male with bilateral ureteral stones  The risks, benefits and alternatives of cystoscopy with BILATERAL  ureteroscopy, laser lithotripsy and ureteral stent placement was discussed the patient.  Risks included, but are not limited to: bleeding, urinary tract infection, ureteral injury/avulsion, ureteral stricture formation, retained stone fragments, the possibility that multiple surgeries may be required to treat the stone(s), MI, stroke, PE and the inherent risks of general anesthesia.  The patient voices understanding and wishes to proceed.      Ellison Hughs, MD 01/08/2018, 10:20 AM  [facility] Pager: [phone]

## 2018-01-08 NOTE — Discharge Instructions (Signed)
Advil,motrin as needed after 6 pm   Post Anesthesia Home Care Instructions  Activity: Get plenty of rest for the remainder of the day. A responsible individual must stay with you for 24 hours following the procedure.  For the next 24 hours, DO NOT: -Drive a car -Paediatric nurse -Drink alcoholic beverages -Take any medication unless instructed by your physician -Make any legal decisions or sign important papers.  Meals: Start with liquid foods such as gelatin or soup. Progress to regular foods as tolerated. Avoid greasy, spicy, heavy foods. If nausea and/or vomiting occur, drink only clear liquids until the nausea and/or vomiting subsides. Call your physician if vomiting continues.  Special Instructions/Symptoms: Your throat may feel dry or sore from the anesthesia or the breathing tube placed in your throat during surgery. If this causes discomfort, gargle with warm salt water. The discomfort should disappear within 24 hours.  If you had a scopolamine patch placed behind your ear for the management of post- operative nausea and/or vomiting:  1. The medication in the patch is effective for 72 hours, after which it should be removed.  Wrap patch in a tissue and discard in the trash. Wash hands thoroughly with soap and water. 2. You may remove the patch earlier than 72 hours if you experience unpleasant side effects which may include dry mouth, dizziness or visual disturbances. 3. Avoid touching the patch. Wash your hands with soap and water after contact with the patch.

## 2018-01-08 NOTE — Op Note (Addendum)
Operative Note  Preoperative diagnosis:  1.  Bilateral obstructing ureteral stones 2.  Left UPJ stone   Postoperative diagnosis: 1.  Bilateral obstructing ureteral stones 2.  The left renal pelvis was filled with fibrinous debris and the left UPJ stone cannot be identified  Procedure(s): 1.  Cystoscopy with bilateral ureteroscopy, bilateral laser lithotripsy and bilateral JJ stent placement 2.  Bilateral stent exchange  Surgeon: Ellison Hughs, MD  Assistants:  None  Anesthesia:  General  Complications:  None  EBL: Less than 5 mL  Specimens: 1.  Bilateral ureteral stones  Drains/Catheters: 1.  Bilateral 6 French by 28 cm JJ stents without tether  Intraoperative findings:   1. Bilateral, distal obstructing ureteral stones 2.   When the flexible ureteroscope was advanced up to the left renal pelvis, he was found to have a large amount of fibrinous debris, likely from a prior urinary tract infection.  His 3 mm left UPJ stone could not be identified despite an exhaustive search of the left renal pelvis and its associated calyces.  Indication:  Andre Morrison is a 57 y.o. male with bilateral obstructing ureteral stones, status post bilateral JJ stent placement with Dr. Arther Dames on 12/11/2017.  He is here today for definitive stone treatment.  The risk, benefits and alternatives of the above procedures was discussed with the patient.  He voices understanding wishes to proceed.  Description of procedure:  After informed consent was obtained, the patient was brought to the operating room and general LMA anesthesia was administered. The patient was then placed in the dorsolithotomy position and prepped and draped in usual sterile fashion. A timeout was performed. A 23 French rigid cystoscope was then inserted into the urethral meatus and advanced into the bladder under direct vision. A complete bladder survey revealed no intravesical pathology.  The right JJ stent was then  grasped and retracted to the urethral meatus.  A Glidewire was advanced through the lumen of the stent and advanced up to the right renal pelvis, under fluoroscopic guidance.  A ureteroscope was then advanced up the right ureter where his obstructing stone is identified.  A 200 m holmium laser was then used to fracture the stone into several smaller pieces.  A nitinol tipless basket was then used to extract all stone fragments from the lumen of the right ureter.  The ureteroscope was then removed, under direct vision, revealing no ureteral trauma.  A 6 French by 28 cm JJ stent was then placed over the wire and into good position within the right collecting system, confirming placement via fluoroscopy.  His left JJ stent was removed in a similar fashion, placed in the wire through the lumen of the stent and advanced up to the left renal pelvis, under fluoroscopic guidance.  A semirigid ureteroscope was then advanced into the distal aspect of the left ureter, immediately identifying his obstructing ureteral stone.  A 200 m holmium laser was then used to fracture the stone and all stone fragments were removed, in a similar fashion.  A flexible ureteroscope was then advanced up the left ureter, but no obstructing stone in the UPJ was identified.  Upon entry into the left renal pelvis a large amount of fibrinous debris was identified, making identification of his migrated UPJ stone extremely difficult.  I concern for exacerbating a possible urinary tract infection, ureteroscopy was abandoned.  The flexible ureteroscope was then removed under direct vision, leaving the wire in place.  Upon removal of the flexible ureteroscope, no obstructing proximal  stone could be identified.  A 6 French by 28 cm JJ stent was then placed over the wire and into good position within the left collecting system, confirming placement via fluoroscopy.  Plan: The patient will be placed on a 5 day course of Cipro and plan for follow-up  in 1 week for office cystoscopy and bilateral JJ stent removal

## 2018-01-08 NOTE — Anesthesia Preprocedure Evaluation (Signed)
Anesthesia Evaluation  Patient identified by MRN, date of birth, ID band Patient awake    Reviewed: Allergy & Precautions, NPO status , Patient's Chart, lab work & pertinent test results  Airway Mallampati: II  TM Distance: >3 FB Neck ROM: Full    Dental  (+) Teeth Intact, Dental Advisory Given   Pulmonary neg pulmonary ROS, sleep apnea ,    Pulmonary exam normal breath sounds clear to auscultation       Cardiovascular hypertension, Pt. on medications Normal cardiovascular exam Rhythm:Regular Rate:Normal     Neuro/Psych PSYCHIATRIC DISORDERS Depression negative neurological ROS     GI/Hepatic negative GI ROS, Neg liver ROS,   Endo/Other  diabetes, Type 2Obesity   Renal/GU negative Renal ROS     Musculoskeletal negative musculoskeletal ROS (+)   Abdominal   Peds  Hematology negative hematology ROS (+)   Anesthesia Other Findings Day of surgery medications reviewed with the patient.  Reproductive/Obstetrics                             Anesthesia Physical  Anesthesia Plan  ASA: III  Anesthesia Plan: General   Post-op Pain Management:    Induction: Intravenous  PONV Risk Score and Plan: 4 or greater and Midazolam, Dexamethasone and Ondansetron  Airway Management Planned: LMA  Additional Equipment:   Intra-op Plan:   Post-operative Plan: Extubation in OR  Informed Consent: I have reviewed the patients History and Physical, chart, labs and discussed the procedure including the risks, benefits and alternatives for the proposed anesthesia with the patient or authorized representative who has indicated his/her understanding and acceptance.   Dental advisory given  Plan Discussed with: CRNA  Anesthesia Plan Comments:         Anesthesia Quick Evaluation

## 2018-01-08 NOTE — Transfer of Care (Signed)
Immediate Anesthesia Transfer of Care Note  Patient: Andre Morrison  Procedure(s) Performed: CYSTOSCOPY/URETEROSCOPY/HOLMIUM LASER/STENT REMOVAL AND STENT PLACEMENT, STONE BASKET EXTRACTION (Bilateral )  Patient Location: PACU  Anesthesia Type:General  Level of Consciousness: drowsy and patient cooperative  Airway & Oxygen Therapy: Patient Spontanous Breathing and Patient connected to nasal cannula oxygen  Post-op Assessment: Report given to RN and Post -op Vital signs reviewed and stable  Post vital signs: Reviewed and stable  Last Vitals:  Vitals Value Taken Time  BP 127/74 01/08/2018 12:48 PM  Temp    Pulse 72 01/08/2018 12:49 PM  Resp    SpO2 97 % 01/08/2018 12:49 PM  Vitals shown include unvalidated device data.  Last Pain:  Vitals:   01/08/18 0926  TempSrc: Oral         Complications: No apparent anesthesia complications

## 2018-01-11 ENCOUNTER — Encounter (HOSPITAL_BASED_OUTPATIENT_CLINIC_OR_DEPARTMENT_OTHER): Payer: Self-pay | Admitting: Urology

## 2018-01-14 DIAGNOSIS — N201 Calculus of ureter: Secondary | ICD-10-CM | POA: Diagnosis not present

## 2018-01-18 DIAGNOSIS — I82412 Acute embolism and thrombosis of left femoral vein: Secondary | ICD-10-CM | POA: Diagnosis not present

## 2018-01-18 DIAGNOSIS — I8311 Varicose veins of right lower extremity with inflammation: Secondary | ICD-10-CM | POA: Diagnosis not present

## 2018-01-18 DIAGNOSIS — R600 Localized edema: Secondary | ICD-10-CM | POA: Diagnosis not present

## 2018-01-18 DIAGNOSIS — I8312 Varicose veins of left lower extremity with inflammation: Secondary | ICD-10-CM | POA: Diagnosis not present

## 2018-01-19 DIAGNOSIS — I8311 Varicose veins of right lower extremity with inflammation: Secondary | ICD-10-CM | POA: Diagnosis not present

## 2018-01-19 DIAGNOSIS — I83023 Varicose veins of left lower extremity with ulcer of ankle: Secondary | ICD-10-CM | POA: Diagnosis not present

## 2018-01-19 DIAGNOSIS — M79672 Pain in left foot: Secondary | ICD-10-CM | POA: Diagnosis not present

## 2018-01-19 DIAGNOSIS — I82412 Acute embolism and thrombosis of left femoral vein: Secondary | ICD-10-CM | POA: Diagnosis not present

## 2018-01-19 DIAGNOSIS — L97329 Non-pressure chronic ulcer of left ankle with unspecified severity: Secondary | ICD-10-CM | POA: Diagnosis not present

## 2018-01-26 DIAGNOSIS — C44311 Basal cell carcinoma of skin of nose: Secondary | ICD-10-CM | POA: Diagnosis not present

## 2018-02-09 DIAGNOSIS — I82412 Acute embolism and thrombosis of left femoral vein: Secondary | ICD-10-CM | POA: Diagnosis not present

## 2018-02-09 DIAGNOSIS — I87022 Postthrombotic syndrome with inflammation of left lower extremity: Secondary | ICD-10-CM | POA: Diagnosis not present

## 2018-02-20 ENCOUNTER — Observation Stay (HOSPITAL_COMMUNITY)
Admission: EM | Admit: 2018-02-20 | Discharge: 2018-02-21 | Disposition: A | Discharge status: Home / Self Care | Payer: BLUE CROSS/BLUE SHIELD | Attending: Internal Medicine | Admitting: Internal Medicine

## 2018-02-20 ENCOUNTER — Encounter (HOSPITAL_COMMUNITY): Payer: Self-pay | Admitting: *Deleted

## 2018-02-20 ENCOUNTER — Emergency Department (HOSPITAL_COMMUNITY)
Admit: 2018-02-20 | Discharge: 2018-02-20 | Disposition: A | Discharge status: Home / Self Care | Payer: BLUE CROSS/BLUE SHIELD | Attending: Emergency Medicine | Admitting: Emergency Medicine

## 2018-02-20 ENCOUNTER — Other Ambulatory Visit: Payer: Self-pay

## 2018-02-20 DIAGNOSIS — I878 Other specified disorders of veins: Secondary | ICD-10-CM | POA: Diagnosis not present

## 2018-02-20 DIAGNOSIS — E11622 Type 2 diabetes mellitus with other skin ulcer: Secondary | ICD-10-CM | POA: Diagnosis not present

## 2018-02-20 DIAGNOSIS — I82402 Acute embolism and thrombosis of unspecified deep veins of left lower extremity: Secondary | ICD-10-CM | POA: Diagnosis not present

## 2018-02-20 DIAGNOSIS — R609 Edema, unspecified: Secondary | ICD-10-CM | POA: Diagnosis not present

## 2018-02-20 DIAGNOSIS — M810 Age-related osteoporosis without current pathological fracture: Secondary | ICD-10-CM | POA: Insufficient documentation

## 2018-02-20 DIAGNOSIS — I824Y9 Acute embolism and thrombosis of unspecified deep veins of unspecified proximal lower extremity: Secondary | ICD-10-CM

## 2018-02-20 DIAGNOSIS — G4730 Sleep apnea, unspecified: Secondary | ICD-10-CM | POA: Diagnosis present

## 2018-02-20 DIAGNOSIS — I824Y2 Acute embolism and thrombosis of unspecified deep veins of left proximal lower extremity: Secondary | ICD-10-CM | POA: Diagnosis not present

## 2018-02-20 DIAGNOSIS — I10 Essential (primary) hypertension: Secondary | ICD-10-CM | POA: Diagnosis not present

## 2018-02-20 DIAGNOSIS — L03116 Cellulitis of left lower limb: Secondary | ICD-10-CM | POA: Diagnosis not present

## 2018-02-20 DIAGNOSIS — D696 Thrombocytopenia, unspecified: Secondary | ICD-10-CM | POA: Diagnosis not present

## 2018-02-20 DIAGNOSIS — I82532 Chronic embolism and thrombosis of left popliteal vein: Secondary | ICD-10-CM | POA: Insufficient documentation

## 2018-02-20 DIAGNOSIS — L97329 Non-pressure chronic ulcer of left ankle with unspecified severity: Secondary | ICD-10-CM | POA: Insufficient documentation

## 2018-02-20 DIAGNOSIS — Z7901 Long term (current) use of anticoagulants: Secondary | ICD-10-CM | POA: Insufficient documentation

## 2018-02-20 DIAGNOSIS — G4733 Obstructive sleep apnea (adult) (pediatric): Secondary | ICD-10-CM | POA: Insufficient documentation

## 2018-02-20 DIAGNOSIS — Z880 Allergy status to penicillin: Secondary | ICD-10-CM | POA: Insufficient documentation

## 2018-02-20 DIAGNOSIS — M79605 Pain in left leg: Secondary | ICD-10-CM | POA: Diagnosis not present

## 2018-02-20 DIAGNOSIS — Z85828 Personal history of other malignant neoplasm of skin: Secondary | ICD-10-CM | POA: Diagnosis not present

## 2018-02-20 DIAGNOSIS — R52 Pain, unspecified: Secondary | ICD-10-CM | POA: Diagnosis not present

## 2018-02-20 DIAGNOSIS — E1165 Type 2 diabetes mellitus with hyperglycemia: Secondary | ICD-10-CM | POA: Diagnosis not present

## 2018-02-20 DIAGNOSIS — I82512 Chronic embolism and thrombosis of left femoral vein: Secondary | ICD-10-CM | POA: Diagnosis not present

## 2018-02-20 DIAGNOSIS — Z79899 Other long term (current) drug therapy: Secondary | ICD-10-CM | POA: Insufficient documentation

## 2018-02-20 LAB — COMPREHENSIVE METABOLIC PANEL
ALBUMIN: 4.5 g/dL (ref 3.5–5.0)
ALK PHOS: 61 U/L (ref 38–126)
ALT: 27 U/L (ref 0–44)
AST: 29 U/L (ref 15–41)
Anion gap: 14 (ref 5–15)
BUN: 16 mg/dL (ref 6–20)
CALCIUM: 9.3 mg/dL (ref 8.9–10.3)
CHLORIDE: 104 mmol/L (ref 98–111)
CO2: 21 mmol/L — AB (ref 22–32)
CREATININE: 1.05 mg/dL (ref 0.61–1.24)
GFR calc non Af Amer: >60 mL/min (ref >60)
GLUCOSE: 205 mg/dL — AB (ref 70–99)
Potassium: 4.4 mmol/L (ref 3.5–5.1)
SODIUM: 139 mmol/L (ref 135–145)
Total Bilirubin: 1.8 mg/dL — ABNORMAL HIGH (ref 0.3–1.2)
Total Protein: 7.5 g/dL (ref 6.5–8.1)

## 2018-02-20 LAB — PROTIME-INR
INR: 1.28
PROTHROMBIN TIME: 15.9 s — AB (ref 11.4–15.2)

## 2018-02-20 LAB — CBC WITH DIFFERENTIAL/PLATELET
ABS IMMATURE GRANULOCYTES: 0.05 10*3/uL (ref 0.00–0.07)
BASOS ABS: 0 10*3/uL (ref 0.0–0.1)
Basophils Relative: 0 %
EOS PCT: 0 %
Eosinophils Absolute: 0 10*3/uL (ref 0.0–0.5)
HCT: 49.1 % (ref 39.0–52.0)
HEMOGLOBIN: 15.2 g/dL (ref 13.0–17.0)
IMMATURE GRANULOCYTES: 1 %
LYMPHS ABS: 0.9 10*3/uL (ref 0.7–4.0)
LYMPHS PCT: 10 %
MCH: 30.6 pg (ref 26.0–34.0)
MCHC: 31 g/dL (ref 30.0–36.0)
MCV: 99 fL (ref 80.0–100.0)
Monocytes Absolute: 0.7 10*3/uL (ref 0.1–1.0)
Monocytes Relative: 8 %
NEUTROS ABS: 7.5 10*3/uL (ref 1.7–7.7)
NRBC: 0 % (ref 0.0–0.2)
Neutrophils Relative %: 81 %
PLATELETS: 144 10*3/uL — AB (ref 150–400)
RBC: 4.96 MIL/uL (ref 4.22–5.81)
RDW: 14.3 % (ref 11.5–15.5)
WBC MORPHOLOGY: INCREASED
WBC: 9.2 10*3/uL (ref 4.0–10.5)

## 2018-02-20 LAB — HEPARIN LEVEL (UNFRACTIONATED): HEPARIN UNFRACTIONATED: 0.88 [IU]/mL — AB (ref 0.30–0.70)

## 2018-02-20 LAB — APTT: aPTT: 136 seconds — ABNORMAL HIGH (ref 24–36)

## 2018-02-20 LAB — GLUCOSE, CAPILLARY
Glucose-Capillary: 137 mg/dL — ABNORMAL HIGH (ref 70–99)
Glucose-Capillary: 231 mg/dL — ABNORMAL HIGH (ref 70–99)

## 2018-02-20 MED ORDER — SODIUM CHLORIDE 0.9% FLUSH: 10.0000 mL | INTRAVENOUS | Status: DC | PRN

## 2018-02-20 MED ORDER — SODIUM CHLORIDE 0.9 % IV SOLN: 2.0000 g | INTRAVENOUS | Status: DC

## 2018-02-20 MED ORDER — CLINDAMYCIN PHOSPHATE 900 MG/50ML IV SOLN
900.0000 mg | Freq: Three times a day (TID) | INTRAVENOUS | Status: DC
  Administered 2018-02-21 (×2): 900 mg via INTRAVENOUS
  Filled 2018-02-20 (×3): qty 50

## 2018-02-20 MED ORDER — ONDANSETRON HCL 4 MG PO TABS: 4.0000 mg | ORAL_TABLET | Freq: Four times a day (QID) | ORAL | Status: DC | PRN

## 2018-02-20 MED ORDER — SODIUM CHLORIDE 0.9% FLUSH
10.0000 mL | Freq: Two times a day (BID) | INTRAVENOUS | Status: DC
  Administered 2018-02-21: 10 mL

## 2018-02-20 MED ORDER — CLINDAMYCIN PHOSPHATE 900 MG/50ML IV SOLN
900.0000 mg | INTRAVENOUS | Status: AC
  Administered 2018-02-20: 900 mg via INTRAVENOUS
  Filled 2018-02-20 (×2): qty 50

## 2018-02-20 MED ORDER — LOSARTAN POTASSIUM 50 MG PO TABS
25.0000 mg | ORAL_TABLET | Freq: Every day | ORAL | Status: DC
  Administered 2018-02-20 – 2018-02-21 (×2): 25 mg via ORAL
  Filled 2018-02-20 (×2): qty 1

## 2018-02-20 MED ORDER — HYDROCODONE-ACETAMINOPHEN 5-325 MG PO TABS: 1.0000 | ORAL_TABLET | ORAL | Status: DC | PRN

## 2018-02-20 MED ORDER — ACETAMINOPHEN 325 MG PO TABS: 650.0000 mg | ORAL_TABLET | Freq: Four times a day (QID) | ORAL | Status: DC | PRN

## 2018-02-20 MED ORDER — ORAL CARE MOUTH RINSE: 15.0000 mL | Freq: Two times a day (BID) | OROMUCOSAL | Status: DC

## 2018-02-20 MED ORDER — CHLORHEXIDINE GLUCONATE 0.12 % MT SOLN
15.0000 mL | Freq: Two times a day (BID) | OROMUCOSAL | Status: DC
  Administered 2018-02-21: 15 mL via OROMUCOSAL
  Filled 2018-02-20: qty 15

## 2018-02-20 MED ORDER — ONDANSETRON HCL 4 MG/2ML IJ SOLN: 4.0000 mg | Freq: Four times a day (QID) | INTRAMUSCULAR | Status: DC | PRN

## 2018-02-20 MED ORDER — HEPARIN BOLUS VIA INFUSION
5600.0000 [IU] | Freq: Once | INTRAVENOUS | Status: AC
  Administered 2018-02-20: 5600 [IU] via INTRAVENOUS
  Filled 2018-02-20: qty 5600

## 2018-02-20 MED ORDER — SODIUM CHLORIDE 0.9 % IV SOLN
INTRAVENOUS | Status: DC | PRN
  Administered 2018-02-20 – 2018-02-21 (×2): 250 mL via INTRAVENOUS

## 2018-02-20 MED ORDER — ACETAMINOPHEN 650 MG RE SUPP: 650.0000 mg | Freq: Four times a day (QID) | RECTAL | Status: DC | PRN

## 2018-02-20 MED ORDER — HEPARIN (PORCINE) IN NACL 100-0.45 UNIT/ML-% IJ SOLN
1800.0000 [IU]/h | INTRAMUSCULAR | Status: DC
  Administered 2018-02-20 – 2018-02-21 (×2): 1800 [IU]/h via INTRAVENOUS
  Filled 2018-02-20 (×2): qty 250

## 2018-02-20 MED ORDER — INSULIN ASPART 100 UNIT/ML ~~LOC~~ SOLN
0.0000 [IU] | Freq: Three times a day (TID) | SUBCUTANEOUS | Status: DC
  Administered 2018-02-20: 1 [IU] via SUBCUTANEOUS
  Administered 2018-02-21 (×2): 2 [IU] via SUBCUTANEOUS

## 2018-02-20 NOTE — H&P (Signed)
History and Physical    Andre Morrison FWY:[id number] DOB: [birth year] DOA: 02/20/2018  PCP: System, Pcp Not In  Patient coming from: Home  Chief Complaint: Leg swelling and pain  HPI: Andre Morrison is a 57 y.o. male with medical history significant of depression, diabetes mellitus type 2, hypertension, sleep apnea.  Patient presented secondary to worsening left leg pain, redness, swelling.  Symptoms started last night and worsened this morning.  He took some Norco to help with his pain.  He reports an ulcer developing a few months ago and has seen a podiatrist for management.  His ulcer is nonhealing.  Related to this admission, patient reports that about 1 year ago he had surgery to his left knee and was told by his physician that he would have swelling for at least a year.  He noticed that earlier this year his swelling started to decrease but then picked up again later in the year.  About 3-1/2 to 4 weeks ago, he went to see his vascular surgeon who diagnosed him with an acute DVT of his left leg.  He was started on Eliquis and has been taking it daily.  ED Course: Vitals: Afebrile, normal pulse, normal respirations, normotensive, on room air Labs: CO2 of 21, glucose of 205, total bilirubin of 1.8, platelets of 144 Imaging: He is except the lower extremities significant for indeterminate age Medications/Course: Nothing performed  Review of Systems: Review of Systems  Constitutional: Negative for chills and fever.  Respiratory: Negative for cough, shortness of breath and wheezing.   Cardiovascular: Negative for chest pain and palpitations.  Gastrointestinal: Negative for abdominal pain, constipation, diarrhea, nausea and vomiting.  Musculoskeletal: Negative for falls.  All other systems reviewed and are negative.   Past Medical History:  Diagnosis Date  . Basal cell carcinoma    Nose  . Dysuria   . History of kidney stones   . Hypertension   . Hypogonadism in male   . OSA  on CPAP   . Pituitary insufficiency ([place])   . Pre-diabetes   . Umbilical hernia 27/74/1287  . Wears glasses     Past Surgical History:  Procedure Laterality Date  . COLONOSCOPY     x2  . CYSTOSCOPY W/ URETERAL STENT PLACEMENT Bilateral 12/11/2017   Procedure: CYSTOSCOPY WITH RETROGRADE PYELOGRAM/URETERAL STENT PLACEMENT;  Surgeon: Bjorn Loser, MD;  Location: [facility];  Service: Urology;  Laterality: Bilateral;  . CYSTOSCOPY/URETEROSCOPY/HOLMIUM LASER/STENT PLACEMENT Bilateral 01/08/2018   Procedure: CYSTOSCOPY/URETEROSCOPY/HOLMIUM LASER/STENT REMOVAL AND STENT PLACEMENT, STONE BASKET EXTRACTION;  Surgeon: Ceasar Mons, MD;  Location: [facility];  Service: Urology;  Laterality: Bilateral;  . EXTRACORPOREAL SHOCK WAVE LITHOTRIPSY Left 04-30-2009    dr Terance Hart  . PATELLECTOMY Left 06-27-2016   dr Erlinda Hong   partial  . UMBILICAL HERNIA REPAIR  07-12-2003   dr Bubba Camp   incarcerated  . VARICOSE VEIN SURGERY Left 1976     reports that he has never smoked. He has never used smokeless tobacco. He reports that he drinks about 1.0 standard drinks of alcohol per week. He reports that he does not use drugs.  Allergies  Allergen Reactions  . Penicillins Other (See Comments)    Unknown Has patient had a PCN reaction causing immediate rash, facial/tongue/throat swelling, SOB or lightheadedness with hypotension: Unknown Has patient had a PCN reaction causing severe rash involving mucus membranes or skin necrosis: Unknown Has patient had a PCN reaction that required hospitalization: Unknown Has patient had a PCN reaction  occurring within the last 10 years: No If all of the above answers are "NO", then may proceed with Cephalosporin use.  . Tetracycline Other (See Comments)    Hair grows on tongue  . Penicillin G Rash    Has patient had a PCN reaction causing immediate rash, facial/tongue/throat swelling, SOB or lightheadedness with hypotension:  Unknown Has patient had a PCN reaction causing severe rash involving mucus membranes or skin necrosis: Unknown Has patient had a PCN reaction that required hospitalization: Unknown Has patient had a PCN reaction occurring within the last 10 years: No If all of the above answers are "NO", then may proceed with Cephalosporin use.    Family History  Problem Relation Age of Onset  . Liver cancer Father     Prior to Admission medications   Medication Sig Start Date End Date Taking? Authorizing Provider  apixaban (ELIQUIS) 5 MG TABS tablet Take 5 mg by mouth 2 (two) times daily.   Yes [provider]  cetirizine (ZYRTEC) 10 MG tablet Take 10 mg by mouth daily.   Yes [provider]  losartan (COZAAR) 25 MG tablet Take 25 mg by mouth daily.  06/09/16  Yes [provider]  multivitamin-iron-minerals-folic acid (CENTRUM) chewable tablet Chew 1 tablet by mouth daily.   Yes [provider]  tadalafil (CIALIS) 5 MG tablet Take 5 mg by mouth daily as needed for erectile dysfunction.  01/23/18  Yes [provider]  HYDROcodone-acetaminophen (NORCO) 5-325 MG tablet Take 1 tablet by mouth every 4 (four) hours as needed for moderate pain. Patient not taking: Reported on 02/20/2018 01/08/18   Ceasar Mons, MD  ondansetron (ZOFRAN) 4 MG tablet Take 1 tablet (4 mg total) by mouth daily as needed for nausea or vomiting. Patient not taking: Reported on 02/20/2018 01/08/18 01/08/19  Ceasar Mons, MD    Physical Exam:  Physical Exam  Constitutional: He is oriented to person, place, and time. He appears well-developed and well-nourished. No distress.  HENT:  Mouth/Throat: Oropharynx is clear and moist.  Eyes: Pupils are equal, round, and reactive to light. Conjunctivae and EOM are normal.  Neck: Normal range of motion.  Cardiovascular: Normal rate, regular rhythm and normal heart sounds.  No murmur heard. Pulses:      Popliteal pulses are 1+  on the right side, and 1+ on the left side.  Pulmonary/Chest: Effort normal and breath sounds normal. No respiratory distress. He has no wheezes. He has no rales.  Abdominal: Soft. Bowel sounds are normal. He exhibits no distension. There is no tenderness. There is no rebound and no guarding.  Musculoskeletal: Normal range of motion. He exhibits no edema.       Left lower leg: He exhibits tenderness.  Lymphadenopathy:    He has no cervical adenopathy.  Neurological: He is alert and oriented to person, place, and time.  Skin: Skin is warm and dry. Lesion (ulcerative lesion on medial aspect of left ankle) noted. He is not diaphoretic. There is erythema (left leg).  Psychiatric: He has a normal mood and affect.  Vitals reviewed.     Labs on Admission: I have personally reviewed following labs and imaging studies      CBC: Recent Labs  Lab 02/20/18 1021  WBC 9.2  NEUTROABS 7.5  HGB 15.2  HCT 49.1  MCV 99.0  PLT 144*    Basic Metabolic Panel: Recent Labs  Lab 02/20/18 1021  NA 139  K 4.4  CL 104  CO2 21*  GLUCOSE 205*  BUN 16  CREATININE 1.05  CALCIUM 9.3    GFR: CrCl cannot be calculated (Unknown ideal weight.).  Liver Function Tests: Recent Labs  Lab 02/20/18 1021  AST 29  ALT 27  ALKPHOS 61  BILITOT 1.8*  PROT 7.5  ALBUMIN 4.5   No results for input(s): LIPASE, AMYLASE in the last 168 hours. No results for input(s): AMMONIA in the last 168 hours.  Coagulation Profile: No results for input(s): INR, PROTIME in the last 168 hours.  Cardiac Enzymes: No results for input(s): CKTOTAL, CKMB, CKMBINDEX, TROPONINI in the last 168 hours.  BNP (last 3 results) No results for input(s): PROBNP in the last 8760 hours.  HbA1C: No results for input(s): HGBA1C in the last 72 hours.  CBG: No results for input(s): GLUCAP in the last 168 hours.  Lipid Profile: No results for input(s): CHOL, HDL, LDLCALC, TRIG, CHOLHDL, LDLDIRECT in the last 72  hours.  Thyroid Function Tests: No results for input(s): TSH, T4TOTAL, FREET4, T3FREE, THYROIDAB in the last 72 hours.  Anemia Panel: No results for input(s): VITAMINB12, FOLATE, FERRITIN, TIBC, IRON, RETICCTPCT in the last 72 hours.  Urine analysis: No results found for: COLORURINE, APPEARANCEUR, LABSPEC, PHURINE, GLUCOSEU, HGBUR, BILIRUBINUR, KETONESUR, PROTEINUR, UROBILINOGEN, NITRITE, LEUKOCYTESUR   Radiological Exams on Admission: No results found.  EKG: None  Assessment/Plan Active Problems:   Type 2 diabetes mellitus with hyperglycemia (HCC)   Essential hypertension   Left leg pain   Left leg pain/swelling Appears consistent with cellulitis with patient does have a history of DVT.  Venous duplex significant for indeterminate age for DVT in his leg.  Patient has been adherent with his Eliquis.  Case discussed with vascular surgery will evaluate patient but does not anticipate any surgical intervention. Erythema is circumferential but is improved from earlier per wife (without intervention other than elevation). -Vascular surgery recommendations: Heparin drip, bedrest, treat as cellulitis and they will evaluate -Clindamycin for cellulitis since patient has a penicillin allergy and is unsure of what his reaction was.  He cannot confirm or deny anaphylaxis  -Hold apixaban  History of diabetes/prediabetes Last hemoglobin A1c of 6.1% in June 2009.  Patient states his hemoglobin A1c is in the high fives to low 6 range.  Glucose on current metabolic panel is over 989 and patient states he has not eaten since last night.  He has been managing his diabetes with diet control and weight loss -Hemoglobin A1c -Carb modified diet -SSI  Essential hypertension -Losartan   DVT prophylaxis: Heparin drip Code Status: Full code Family Communication: Wife at bedside Disposition Plan: Medical floor Consults called: Vascular surgery Admission status: Inpatient   Cordelia Poche, MD [facility] 02/20/2018, 4:06 PM  If 7PM-7AM, please contact night-coverage [email or web address] Password [email or web address]

## 2018-02-20 NOTE — Progress Notes (Signed)
Left lower extremity venous duplex has been completed. There is evidence of age indeterminate deep vein thrombosis involving the femoral, and popliteal veins of the left lower extremity. Results were given to Dr. Alvino Chapel.  02/20/18 10:40 AM Andre Morrison RVT

## 2018-02-20 NOTE — ED Notes (Signed)
Lab has stated they never received the blood tubes.

## 2018-02-20 NOTE — Progress Notes (Signed)
Patient arrived on unit via stretcher from ED.  Wife at bedside.   

## 2018-02-20 NOTE — ED Notes (Signed)
Pt waiting for admitting consult to come talk to him

## 2018-02-20 NOTE — Progress Notes (Signed)
Patient agreeable to second IV site after he spoke with MD.  IV team notified.  17:00 dose of Cleocin moved to 20:00 d/t waiting for IV placement.  Pharmacy notified.

## 2018-02-20 NOTE — Consult Note (Signed)
[facility] [place]  Patient name: Andre Morrison MRN: [id number] DOB: 04/12/1961 Sex: male  REASON FOR CONSULT: Left leg cellulitis and DVT  HPI: Andre Morrison is a 57 y.o. male, who is seen for evaluation of worsening symptoms in his left leg.  He has a very extensive past history of venous pathology.  He has history dating back to 1976 as a teenager of vein surgery in his left leg.  In looking at his leg it I suspect that this was simply removal of varicosities.  I do not see any surgical evidence of vein stripping.  He had some hemosiderin deposits bilaterally but no significant swelling over the years and was relating this to obesity.  He had a patella surgery in March 2018 and had swelling following this.  I do not see any evidence of duplex following this.  He was told that this may take up to a year to resolve and that when it did not to continue to resolve he was referred to [facility].  An ultrasound several months ago showed DVT in his popliteal and femoral vein.  Was felt that this was not acute.  He was placed on Eliquis despite this to assure that he would have as much chance for recanalization is possible.  He developed a ulceration below the level of his left medial malleolus and this is been present for several months.  He is had appropriate treatment with the compression and has plans for what sounds like Unna boot placement at [facility].  He has follow-up with Dr. Aleda Grana planned in the next week or 2.  He did a great deal of walking last night and this morning found much more swelling and marked erythema and pain in his calf ankle and up to the level of his knee.  He had of difficulty walking due to this and called 911 and came to [facility] emergency room.  He has no history of arterial insufficiency.  He has multiple medical issues as outlined below.  He has no history of hypercoagulability or family history  of hypercoagulability  Past Medical History:  Diagnosis Date  . Basal cell carcinoma    Nose  . Dysuria   . History of kidney stones   . Hypertension   . Hypogonadism in male   . OSA on CPAP   . Pituitary insufficiency ([place])   . Pre-diabetes   . Umbilical hernia 00/93/8182  . Wears glasses     Family History  Problem Relation Age of Onset  . Liver cancer Father     SOCIAL HISTORY: Social History   Socioeconomic History  . Marital status: Married    Spouse name: Not on file  . Number of children: Not on file  . Years of education: Not on file  . Highest education level: Not on file  Occupational History  . Not on file  Social Needs  . Financial resource strain: Not on file  . Food insecurity:    Worry: Not on file    Inability: Not on file  . Transportation needs:    Medical: Not on file    Non-medical: Not on file  Tobacco Use  . Smoking status: Never Smoker  . Smokeless tobacco: Never Used  Substance and Sexual Activity  . Alcohol use: Yes    Alcohol/week: 1.0 standard drinks    Types: 1 Glasses of wine per week    Comment: every other day  . Drug  use: Never  . Sexual activity: Not on file  Lifestyle  . Physical activity:    Days per week: Not on file    Minutes per session: Not on file  . Stress: Not on file  Relationships  . Social connections:    Talks on phone: Not on file    Gets together: Not on file    Attends religious service: Not on file    Active member of club or organization: Not on file    Attends meetings of clubs or organizations: Not on file    Relationship status: Not on file  . Intimate partner violence:    Fear of current or ex partner: Not on file    Emotionally abused: Not on file    Physically abused: Not on file    Forced sexual activity: Not on file  Other Topics Concern  . Not on file  Social History Narrative  . Not on file    Allergies  Allergen Reactions  . Penicillins Other (See Comments)    Unknown Has  patient had a PCN reaction causing immediate rash, facial/tongue/throat swelling, SOB or lightheadedness with hypotension: Unknown Has patient had a PCN reaction causing severe rash involving mucus membranes or skin necrosis: Unknown Has patient had a PCN reaction that required hospitalization: Unknown Has patient had a PCN reaction occurring within the last 10 years: No If all of the above answers are "NO", then may proceed with Cephalosporin use.  . Tetracycline Other (See Comments)    Hair grows on tongue  . Penicillin G Rash    Has patient had a PCN reaction causing immediate rash, facial/tongue/throat swelling, SOB or lightheadedness with hypotension: Unknown Has patient had a PCN reaction causing severe rash involving mucus membranes or skin necrosis: Unknown Has patient had a PCN reaction that required hospitalization: Unknown Has patient had a PCN reaction occurring within the last 10 years: No If all of the above answers are "NO", then may proceed with Cephalosporin use.    Current Facility-Administered Medications  Medication Dose Route Frequency Provider Last Rate Last Dose  . clindamycin (CLEOCIN) IVPB 900 mg  900 mg Intravenous STAT Mariel Aloe, MD      . Derrill Memo ON 02/21/2018] clindamycin (CLEOCIN) IVPB 900 mg  900 mg Intravenous Q8H Mariel Aloe, MD      . heparin ADULT infusion 100 units/mL (25000 units/275mL sodium chloride 0.45%)  1,800 Units/hr Intravenous Continuous Lenis Noon, RPH      . heparin bolus via infusion 5,600 Units  5,600 Units Intravenous Once Lenis Noon, [facility]      . insulin aspart (novoLOG) injection 0-9 Units  0-9 Units Subcutaneous TID WC Mariel Aloe, MD       Current Outpatient Medications  Medication Sig Dispense Refill  . apixaban (ELIQUIS) 5 MG TABS tablet Take 5 mg by mouth 2 (two) times daily.    . cetirizine (ZYRTEC) 10 MG tablet Take 10 mg by mouth daily.    Marland Kitchen losartan (COZAAR) 25 MG tablet Take 25 mg by mouth daily.     .  multivitamin-iron-minerals-folic acid (CENTRUM) chewable tablet Chew 1 tablet by mouth daily.    . tadalafil (CIALIS) 5 MG tablet Take 5 mg by mouth daily as needed for erectile dysfunction.   0  . HYDROcodone-acetaminophen (NORCO) 5-325 MG tablet Take 1 tablet by mouth every 4 (four) hours as needed for moderate pain. (Patient not taking: Reported on 02/20/2018) 20 tablet 0  . ondansetron (ZOFRAN)  4 MG tablet Take 1 tablet (4 mg total) by mouth daily as needed for nausea or vomiting. (Patient not taking: Reported on 02/20/2018) 30 tablet 1    REVIEW OF SYSTEMS:  Reviewed and his history and physical with nothing to add  PHYSICAL EXAM: Vitals:   02/20/18 1158 02/20/18 1352 02/20/18 1446 02/20/18 1628  BP: 139/85 (!) 119/58 120/62   Pulse: 100 96 (!) 101   Resp: 20 18 18    Temp:      TempSrc:      SpO2: 99% 99% 95%   Weight:    120.2 kg  Height:    6\' 4"  (1.93 m)    GENERAL: The patient is a well-nourished male, in no acute distress. The vital signs are documented above. CARDIOVASCULAR: 2+ dorsalis pedis pulses bilaterally PULMONARY: There is good air exchange  ABDOMEN: Soft and non-tender  MUSCULOSKELETAL: There are no major deformities or cyanosis. NEUROLOGIC: No focal weakness or paresthesias are detected. SKIN: Chronic hemosiderin deposits in his right medial calf and ankle. His left leg is noted for marked erythema with probable cellulitis beginning in his knee and extending down to his ankle.  He has a very superficial 1 to 2 cm ulceration distal to the medial malleolus.  Does have changes with chronic venous hypertension in his left leg as well PSYCHIATRIC: The patient has a normal affect.  DATA:  DVT study shows no evidence of acute clot in his left femoral vein and left popliteal vein  MEDICAL ISSUES: I had an extensive discussion with the patient and his wife present.  I suspect that in all likelihood he developed clot around the time of his patellar surgery in 2018.  I  explained that there was no way to confirm this since there were no studies around this but it does appear that he had swelling worsening since then.  The study several months ago in all likelihood was chronic but even if it was not he has been on several months of oral anticoagulation with Eliquis.  I explained trans-standard treatment would be 6 months of anticoagulation and then discontinue this.  Explained that there is no surgical option for his chronic DVT and also explained that even if this was acute at his current level would not be indication for lysis or mechanical thrombectomy.  Suspect that he has developed cellulitis related to the open ulcer on his ankle.  Agree with anticoagulation with heparin while he is in the hospital and then discharged back on oral agent.  Will need IV antibiotics and then conversion to oral antibiotics at discharge.  Explained that he is certainly at risk for recurrent episodes of cellulitis related to his hypertension.  I did explain that his chronic venous stasis disease was progressive over his lifetime and the critical importance of elevating his legs higher than his heart whenever possible and also the critical need for knee-high graduated compression garments.  He understands.  We will continue his follow-up with [facility] and will see [place] as needed   Rosetta Posner, MD [facility] Tel [phone] Pager [phone]

## 2018-02-20 NOTE — Progress Notes (Addendum)
ANTICOAGULATION CONSULT NOTE - Initial Consult  Pharmacy Consult for heparin Indication: DVT  Allergies  Allergen Reactions  . Penicillins Other (See Comments)    Unknown Has patient had a PCN reaction causing immediate rash, facial/tongue/throat swelling, SOB or lightheadedness with hypotension: Unknown Has patient had a PCN reaction causing severe rash involving mucus membranes or skin necrosis: Unknown Has patient had a PCN reaction that required hospitalization: Unknown Has patient had a PCN reaction occurring within the last 10 years: No If all of the above answers are "NO", then may proceed with Cephalosporin use.  . Tetracycline Other (See Comments)    Hair grows on tongue  . Penicillin G Rash    Has patient had a PCN reaction causing immediate rash, facial/tongue/throat swelling, SOB or lightheadedness with hypotension: Unknown Has patient had a PCN reaction causing severe rash involving mucus membranes or skin necrosis: Unknown Has patient had a PCN reaction that required hospitalization: Unknown Has patient had a PCN reaction occurring within the last 10 years: No If all of the above answers are "NO", then may proceed with Cephalosporin use.    Patient Measurements: Height: 6\' 4"  (193 cm) Weight: 265 lb (120.2 kg) IBW/kg (Calculated) : 86.8 Heparin Dosing Weight: 109 kg  Vital Signs: Temp: 99.3 F (37.4 C) (11/02 0928) Temp Source: Oral (11/02 0928) BP: 120/62 (11/02 1446) Pulse Rate: 101 (11/02 1446)  Labs: Recent Labs    02/20/18 1021  HGB 15.2  HCT 49.1  PLT 144*  CREATININE 1.05    Estimated Creatinine Clearance: 108.7 mL/min (by C-G formula based on SCr of 1.05 mg/dL).   Medical History: Past Medical History:  Diagnosis Date  . Basal cell carcinoma    Nose  . Dysuria   . History of kidney stones   . Hypertension   . Hypogonadism in male   . OSA on CPAP   . Pituitary insufficiency ([place])   . Pre-diabetes   . Umbilical hernia 00/93/8182  .  Wears glasses    Assessment: Pharmacy consulted to dose and monitor heparin drip in this 57 year old male. Pt was recently diagnosed with LE DVT ~3 weeks and started on apixaban. He is currently on apixaban 5 mg PO BID with last dose taken 11/1 ~22:30.  Pt presented to ED with LE swelling/erythema, increased pain. Concern for infection vs. Worsening occlusion.   Today, 02/20/18  Hgb 15.2 WNL  Plt 144 slightly low  STAT HL, APTT, and protime-INR ordered. If baseline HL falsely elevated due to apixaban, will need to monitor/adjust using APTT until APTT and HL correlate.  Goal of Therapy:  Heparin level 0.3-0.7 units/ml APTT 66 - 102 seconds Monitor platelets by anticoagulation protocol: Yes   Plan:  Give 5600 units bolus x 1  Start heparin infusion at 1800 units/hr Check aPTT and HL level in 6 hours and daily while on heparin Continue to monitor H&H and platelets Monitor for signs/symptoms of bleeding  Lenis Noon, PharmD, BCPS Clinical Pharmacist 02/20/2018,4:35 PM  Addendum: -Initial heparin bolus given since it had been > 12 hours since last apixaban dose and there was concern for worsening occlusion on admission.  Baseline labs (APTT and HL) are elevated (APTT 136 s, HL 0.88). However, these were drawn at 18:10 and heparin bolus/infusion given at 17:40 so these are not accurate baseline labs. No signs/symptoms of bleeding per RN.  Will check a 6 hour HL and aPTT and adjust as needed.   Lenis Noon, PharmD 02/20/18 7:44 PM

## 2018-02-20 NOTE — ED Notes (Signed)
ED TO INPATIENT HANDOFF REPORT  Name/Age/Gender Andre Morrison 57 y.o. male  Code Status   Home/SNF/Other Home  Chief Complaint r/o dvt   Level of Care/Admitting Diagnosis ED Disposition    ED Disposition Condition [facility] Area: [place] [[id number]]  Level of Care: Med-Surg [16]  Diagnosis: Left leg pain [321812]  Admitting Physician: Mariel Aloe [[id number]]  Attending Physician: Mariel Aloe [phone]  Estimated length of stay: past midnight tomorrow  Certification:: I certify this patient will need inpatient services for at least 2 midnights  PT Class (Do Not Modify): Inpatient [101]  PT Acc Code (Do Not Modify): Private [1]       Medical History Past Medical History:  Diagnosis Date  . Basal cell carcinoma    Nose  . Dysuria   . History of kidney stones   . Hypertension   . Hypogonadism in male   . OSA on CPAP   . Pituitary insufficiency ([place])   . Pre-diabetes   . Umbilical hernia 53/66/4403  . Wears glasses     Allergies Allergies  Allergen Reactions  . Penicillins Other (See Comments)    Unknown Has patient had a PCN reaction causing immediate rash, facial/tongue/throat swelling, SOB or lightheadedness with hypotension: Unknown Has patient had a PCN reaction causing severe rash involving mucus membranes or skin necrosis: Unknown Has patient had a PCN reaction that required hospitalization: Unknown Has patient had a PCN reaction occurring within the last 10 years: No If all of the above answers are "NO", then may proceed with Cephalosporin use.  . Tetracycline Other (See Comments)    Hair grows on tongue  . Penicillin G Rash    Has patient had a PCN reaction causing immediate rash, facial/tongue/throat swelling, SOB or lightheadedness with hypotension: Unknown Has patient had a PCN reaction causing severe rash involving mucus membranes or skin necrosis: Unknown Has patient had a PCN reaction that required  hospitalization: Unknown Has patient had a PCN reaction occurring within the last 10 years: No If all of the above answers are "NO", then may proceed with Cephalosporin use.    IV Location/Drains/Wounds Patient Lines/Drains/Airways Status   Active Line/Drains/Airways    Name:   Placement date:   Placement time:   Site:   Days:   Peripheral IV 02/20/18 Right Hand   02/20/18    1613    Hand   less than 1   Ureteral Drain/Stent Left ureter 6 Fr.   01/08/18    1204    Left ureter   43   Ureteral Drain/Stent Right ureter 6 Fr.   01/08/18    1230    Right ureter   43   Incision (Closed) 12/11/17 Perineum   12/11/17    1432     71   Incision (Closed) 01/08/18 Penis Other (Comment)   01/08/18    1121     43          Labs/Imaging Results for orders placed or performed during the hospital encounter of 02/20/18 (from the past 48 hour(s))  CBC with Differential     Status: Abnormal   Collection Time: 02/20/18 10:21 AM  Result Value Ref Range   WBC 9.2 4.0 - 10.5 K/uL   RBC 4.96 4.22 - 5.81 MIL/uL   Hemoglobin 15.2 13.0 - 17.0 g/dL   HCT 49.1 39.0 - 52.0 %   MCV 99.0 80.0 - 100.0 fL   MCH 30.6 26.0 - 34.0  pg   MCHC 31.0 30.0 - 36.0 g/dL   RDW 14.3 11.5 - 15.5 %   Platelets 144 (L) 150 - 400 K/uL   nRBC 0.0 0.0 - 0.2 %   Neutrophils Relative % 81 %   Neutro Abs 7.5 1.7 - 7.7 K/uL   Lymphocytes Relative 10 %   Lymphs Abs 0.9 0.7 - 4.0 K/uL   Monocytes Relative 8 %   Monocytes Absolute 0.7 0.1 - 1.0 K/uL   Eosinophils Relative 0 %   Eosinophils Absolute 0.0 0.0 - 0.5 K/uL   Basophils Relative 0 %   Basophils Absolute 0.0 0.0 - 0.1 K/uL   WBC Morphology INCREASED BANDS (>20% BANDS)    Immature Granulocytes 1 %   Abs Immature Granulocytes 0.05 0.00 - 0.07 K/uL    Comment: Performed at [facility], [place] [address] [place], [place] [address]  Comprehensive metabolic panel     Status: Abnormal   Collection Time: 02/20/18 10:21 AM  Result Value Ref Range   Sodium 139  135 - 145 mmol/L   Potassium 4.4 3.5 - 5.1 mmol/L   Chloride 104 98 - 111 mmol/L   CO2 21 (L) 22 - 32 mmol/L   Glucose, Bld 205 (H) 70 - 99 mg/dL   BUN 16 6 - 20 mg/dL   Creatinine, Ser 1.05 0.61 - 1.24 mg/dL   Calcium 9.3 8.9 - 10.3 mg/dL   Total Protein 7.5 6.5 - 8.1 g/dL   Albumin 4.5 3.5 - 5.0 g/dL   AST 29 15 - 41 U/L   ALT 27 0 - 44 U/L   Alkaline Phosphatase 61 38 - 126 U/L   Total Bilirubin 1.8 (H) 0.3 - 1.2 mg/dL   GFR calc non Af Amer >60 >60 mL/min   GFR calc Af Amer >60 >60 mL/min    Comment: (NOTE) The eGFR has been calculated using the CKD EPI equation. This calculation has not been validated in all clinical situations. eGFR's persistently <60 mL/min signify possible Chronic Kidney Disease.    Anion gap 14 5 - 15    Comment: Performed at [facility], [place] [address], [place],  [address]   No results found. None  Pending Labs [employer] (From admission, onward)    Start     Ordered   Signed and Held  Hemoglobin A1c  Tomorrow morning,   R     Signed and Held   Signed and Held  HIV antibody (Routine Testing)  Tomorrow morning,   R     Signed and Held   Signed and Held  Basic metabolic panel  Tomorrow morning,   R     Signed and Held   Signed and Held  CBC  Tomorrow morning,   R     Signed and Held          Vitals/Pain Today's Vitals   02/20/18 0928 02/20/18 1158 02/20/18 1352 02/20/18 1446  BP: 112/67 139/85 (!) 119/58 120/62  Pulse: (!) 101 100 96 (!) 101  Resp: _0 Temp: 99.3 F (37.4 C)     TempSrc: Oral     SpO2: 100% 99% 99% 95%    Isolation Precautions No active isolations  Medications Medications  cefTRIAXone (ROCEPHIN) 2 g in sodium chloride 0.9 % 100 mL IVPB (has no administration in time range)    Mobility Walks, [place]

## 2018-02-20 NOTE — Progress Notes (Signed)
02-20-18  1755   IV NOTE;  Consult was placed to place a 2nd iv ASAP for antibiotics;  Pt has 1 iv in the R hand with heparin infusing;  Ultrasound used, and vein accessed very easily, on the first attempt,  but was unable to thread the catheter.  Pt became extremely upset, saying "I've got a 3 stick limit, and you've gone past it;"  Pt had only been attempted x 1 by myself;  He is refusing any other "poking" including fingersticks for blood sugar.  Pt stated "I want to eat first."   Pt's wife at bedside, trying to reason with him and explain that 2nd IV is for antibiotics;  He again became very upset, saying "this is my body, my life!"  Bed lowered to the floor and pt given his dinner;   RN aware that pt refused a 2nd attempt by IV Team;  Raynelle Fanning RN  IV Team

## 2018-02-20 NOTE — ED Provider Notes (Signed)
[facility] DEPT Provider Note   CSN: [id number] Arrival date & time: 02/20/18  2952     History   Chief Complaint Chief Complaint  Patient presents with  . Leg Pain    HPI Andre Morrison is a 57 y.o. male with past medical history significant for left patellar fracture requiring surgical intervention, diabetes, hypertension, known left leg DVT who presents for evaluation of left leg swelling.  Patient states he has had intermittent left lower extremity swelling over the last year since his left patellar fracture.  Patient states in August of this year he was diagnosed with a DVT.  Patient was started on Eliquis approximately 3 weeks ago for this.  Patient states yesterday he noticed pain to his left lower extremity.  States when he awoke this morning his left lower extremity was swollen, red and extremely tender to the touch.  Denies fever, chills, nausea, vomiting, chest pain, shortness of breath, cough, hemoptysis, decreased sensation in bilateral lower extremity, weakness.  Admits to history of left medial malleolus ulcer.  Has been seen by [place] [facility] as well as care for this.  Patient states it is been well healing.  Denies active drainage from site.  History obtained from patient.  No interpreter was used.  HPI  Past Medical History:  Diagnosis Date  . Basal cell carcinoma    Nose  . Dysuria   . History of kidney stones   . Hypertension   . Hypogonadism in male   . OSA on CPAP   . Pituitary insufficiency ([place])   . Pre-diabetes   . Umbilical hernia 84/13/2440  . Wears glasses     Patient Active Problem List   Diagnosis Date Noted  . Left leg pain 02/20/2018  . Nondisplaced fracture of distal end of right radius 09/25/2016  . Displaced transverse fracture of left patella, subsequent encounter for closed fracture with delayed healing 06/20/2016  . Left patella fracture 06/20/2016  . UMBILICAL HERNIA, HX OF 02/15/2535  . PITUITARY  INSUFFICIENCY 10/12/2007  . HYPOGONADISM, MALE 10/12/2007  . SLEEP APNEA 10/12/2007  . DIABETES MELLITUS, TYPE II 06/07/2007  . HYPERTENSION 06/07/2007  . DEPRESSION 10/06/2006  . OSTEOPOROSIS 10/06/2006    Past Surgical History:  Procedure Laterality Date  . COLONOSCOPY     x2  . CYSTOSCOPY W/ URETERAL STENT PLACEMENT Bilateral 12/11/2017   Procedure: CYSTOSCOPY WITH RETROGRADE PYELOGRAM/URETERAL STENT PLACEMENT;  Surgeon: Bjorn Loser, MD;  Location: [facility];  Service: Urology;  Laterality: Bilateral;  . CYSTOSCOPY/URETEROSCOPY/HOLMIUM LASER/STENT PLACEMENT Bilateral 01/08/2018   Procedure: CYSTOSCOPY/URETEROSCOPY/HOLMIUM LASER/STENT REMOVAL AND STENT PLACEMENT, STONE BASKET EXTRACTION;  Surgeon: Ceasar Mons, MD;  Location: [facility];  Service: Urology;  Laterality: Bilateral;  . EXTRACORPOREAL SHOCK WAVE LITHOTRIPSY Left 04-30-2009    dr Terance Hart  . PATELLECTOMY Left 06-27-2016   dr Erlinda Hong   partial  . UMBILICAL HERNIA REPAIR  07-12-2003   dr Bubba Camp   incarcerated  . VARICOSE VEIN SURGERY Left 1976        Home Medications    Prior to Admission medications   Medication Sig Start Date End Date Taking? Authorizing Provider  apixaban (ELIQUIS) 5 MG TABS tablet Take 5 mg by mouth 2 (two) times daily.   Yes [provider]  cetirizine (ZYRTEC) 10 MG tablet Take 10 mg by mouth daily.   Yes [provider]  losartan (COZAAR) 25 MG tablet Take 25 mg by mouth daily.  06/09/16  Yes [provider]  multivitamin-iron-minerals-folic acid (CENTRUM) chewable tablet Chew 1 tablet by mouth daily.   Yes [provider]  tadalafil (CIALIS) 5 MG tablet Take 5 mg by mouth daily as needed for erectile dysfunction.  01/23/18  Yes [provider]  HYDROcodone-acetaminophen (NORCO) 5-325 MG tablet Take 1 tablet by mouth every 4 (four) hours as needed for moderate pain. Patient not taking: Reported on 02/20/2018  01/08/18   Ceasar Mons, MD  ondansetron (ZOFRAN) 4 MG tablet Take 1 tablet (4 mg total) by mouth daily as needed for nausea or vomiting. Patient not taking: Reported on 02/20/2018 01/08/18 01/08/19  Ceasar Mons, MD    Family History Family History  Problem Relation Age of Onset  . Liver cancer Father     Social History Social History   Tobacco Use  . Smoking status: Never Smoker  . Smokeless tobacco: Never Used  Substance Use Topics  . Alcohol use: Yes    Alcohol/week: 1.0 standard drinks    Types: 1 Glasses of wine per week    Comment: every other day  . Drug use: Never     Allergies   Penicillins; Tetracycline; and Penicillin g   Review of Systems Review of Systems  Constitutional: Negative.   HENT: Negative.   Respiratory: Negative.   Cardiovascular: Negative.   Gastrointestinal: Negative.   Genitourinary: Negative.   Musculoskeletal:       Left lower extremity with edema, erythema and warmth.  Skin: Positive for wound.  Neurological: Negative.   All other systems reviewed and are negative.    Physical Exam Updated Vital Signs BP 120/62   Pulse (!) 101   Temp 99.3 F (37.4 C) (Oral)   Resp 18   SpO2 95%   Physical Exam  Constitutional: He appears well-developed and well-nourished. No distress.  HENT:  Head: Atraumatic.  Mouth/Throat: Oropharynx is clear and moist.  Eyes: Pupils are equal, round, and reactive to light.  Neck: Normal range of motion. Neck supple.  Cardiovascular: Normal rate, regular rhythm, normal heart sounds and intact distal pulses.  Pulses:      Dorsalis pedis pulses are 2+ on the right side, and 2+ on the left side.  Pulmonary/Chest: Effort normal and breath sounds normal. No stridor. No respiratory distress. He has no wheezes. He has no rales. He exhibits no tenderness.  Abdominal: Soft. Bowel sounds are normal. He exhibits no distension and no mass. There is no tenderness. There is no rebound and no  guarding. No hernia.  Musculoskeletal: Normal range of motion.       Right foot: There is normal range of motion and no deformity.       Left foot: There is normal range of motion and no deformity.  Full ROM to bilateral lower extremity. Tenderness to palpation to posterior left calf. Denies tenderness to knee. Able to ambulate however with pain to left calf.  Feet:  Right Foot:  Skin Integrity: Positive for dry skin. Negative for ulcer, blister, skin breakdown, erythema, warmth or callus.  Left Foot:  Skin Integrity: Positive for ulcer, erythema, warmth and dry skin. Negative for blister, skin breakdown or callus.  Neurological: He is alert.  Sensation intact bilateral lower extremity.  Skin: Skin is warm and dry. He is not diaphoretic.  Erythema which begins at the ankle and extends up to the knee.  Edema to left lower extremity.  Tenderness palpation left lower extremity. Quarter size rounded medial malleolus ulcer.  Ulcer appears to be well-healing and is not  actively draining.  There is no surrounding erythema to ulcer.  Bilateral venous stasis skin changes.  Psychiatric: He has a normal mood and affect.  Nursing note and vitals reviewed.    ED Treatments / Results  Labs (all labs ordered are listed, but only abnormal results are displayed) Labs Reviewed  CBC WITH DIFFERENTIAL/PLATELET - Abnormal; Notable for the following components:      Result Value   Platelets 144 (*)    All other components within normal limits  COMPREHENSIVE METABOLIC PANEL - Abnormal; Notable for the following components:   CO2 21 (*)    Glucose, Bld 205 (*)    Total Bilirubin 1.8 (*)    All other components within normal limits    EKG None  Radiology No results found.  Procedures Procedures (including critical care time)  Medications Ordered in ED Medications - No data to display   Initial Impression / Assessment and Plan / ED Course  I have reviewed the triage vital signs and the nursing  notes.  Pertinent labs & imaging results that were available during my care of the patient were reviewed by me and considered in my medical decision making (see chart for details).  57 year old male who appears otherwise well presents for evaluation of left lower extremity swelling. Afebrile, non septic, non-ill appearing. Hx of known DVT, on Eliquis.  Sudden onset left lower extremity edema, erythema and warmth.  Quarter size medial malleolus ulcer, seen by [place] [facility] as well as wound care.  This area seems to be well healing without active drainage or surrounding erythema.  Bilateral venous stasis skin changes.  Concern for cellulitis vs worsening DVT despite anticoagulation.  Will obtain labs and ultrasound and reevaluate. Patient is hemodynamically stable. 2 + DP pulses bilaterally. Denies pleuritic chest pain, SOB, cough, hemoptysis.  Labs without leukocytosis.  Metabolic panel without any electrolyte abnormalities.  Will consult with vascular surgery.  Consulted with Vascular surgery, Dr. Donnetta Hutching. Concern for failed anticoagulation with possible worsening of DVT. Recommends admission to Medicine with Vascular surgery consult at [facility]. Recommends Heparin with IV antibiotics and bed rest.  Will consult with [facility] for admission.  Consulted with [facility], Dr. Teryl Lucy.  Will admit patient and have transported to [facility].  Patient was seen and evaluated with my attending, Dr. Alvino Chapel, who agrees with above treatment, plan and disposition of patient.    Final Clinical Impressions(s) / ED Diagnoses   Final diagnoses:  Deep vein thrombosis (DVT) of proximal lower extremity, unspecified chronicity, unspecified laterality [facility])    ED Discharge Orders    None       Desha Bitner A, PA-C 02/20/18 1601    Davonna Belling, MD 02/20/18 1604

## 2018-02-20 NOTE — ED Notes (Signed)
Pt update by PA regarding plan of care. Continues to rest without complaints

## 2018-02-20 NOTE — ED Triage Notes (Signed)
EMS states pt had a DVT in left leg at the end of August, Recently started on Eliquis. Now pt presents with redness and swelling as well as hot to touch.

## 2018-02-20 NOTE — ED Notes (Signed)
Bed: VG86 Expected date:  Expected time:  Means of arrival:  Comments: Recent dx DVT

## 2018-02-21 DIAGNOSIS — I10 Essential (primary) hypertension: Secondary | ICD-10-CM | POA: Diagnosis not present

## 2018-02-21 DIAGNOSIS — E1165 Type 2 diabetes mellitus with hyperglycemia: Secondary | ICD-10-CM

## 2018-02-21 DIAGNOSIS — L03116 Cellulitis of left lower limb: Secondary | ICD-10-CM

## 2018-02-21 DIAGNOSIS — M79605 Pain in left leg: Secondary | ICD-10-CM | POA: Diagnosis not present

## 2018-02-21 DIAGNOSIS — I824Y9 Acute embolism and thrombosis of unspecified deep veins of unspecified proximal lower extremity: Secondary | ICD-10-CM

## 2018-02-21 DIAGNOSIS — G4730 Sleep apnea, unspecified: Secondary | ICD-10-CM

## 2018-02-21 LAB — CBC
HEMATOCRIT: 40.1 % (ref 39.0–52.0)
HEMOGLOBIN: 12.7 g/dL — AB (ref 13.0–17.0)
MCH: 30.6 pg (ref 26.0–34.0)
MCHC: 31.7 g/dL (ref 30.0–36.0)
MCV: 96.6 fL (ref 80.0–100.0)
NRBC: 0 % (ref 0.0–0.2)
Platelets: 129 10*3/uL — ABNORMAL LOW (ref 150–400)
RBC: 4.15 MIL/uL — ABNORMAL LOW (ref 4.22–5.81)
RDW: 14.5 % (ref 11.5–15.5)
WBC: 8.5 10*3/uL (ref 4.0–10.5)

## 2018-02-21 LAB — HEPARIN LEVEL (UNFRACTIONATED)
HEPARIN UNFRACTIONATED: 0.56 [IU]/mL (ref 0.30–0.70)
Heparin Unfractionated: 0.3 IU/mL (ref 0.30–0.70)

## 2018-02-21 LAB — BASIC METABOLIC PANEL
ANION GAP: 9 (ref 5–15)
BUN: 19 mg/dL (ref 6–20)
CALCIUM: 8.6 mg/dL — AB (ref 8.9–10.3)
CO2: 24 mmol/L (ref 22–32)
Chloride: 105 mmol/L (ref 98–111)
Creatinine, Ser: 1.01 mg/dL (ref 0.61–1.24)
Glucose, Bld: 167 mg/dL — ABNORMAL HIGH (ref 70–99)
Potassium: 3.6 mmol/L (ref 3.5–5.1)
SODIUM: 138 mmol/L (ref 135–145)

## 2018-02-21 LAB — HIV ANTIBODY (ROUTINE TESTING W REFLEX): HIV SCREEN 4TH GENERATION: NONREACTIVE

## 2018-02-21 LAB — APTT: APTT: 91 s — AB (ref 24–36)

## 2018-02-21 LAB — HEMOGLOBIN A1C
Hgb A1c MFr Bld: 6.2 % — ABNORMAL HIGH (ref 4.8–5.6)
MEAN PLASMA GLUCOSE: 131.24 mg/dL

## 2018-02-21 LAB — GLUCOSE, CAPILLARY
Glucose-Capillary: 159 mg/dL — ABNORMAL HIGH (ref 70–99)
Glucose-Capillary: 177 mg/dL — ABNORMAL HIGH (ref 70–99)

## 2018-02-21 MED ORDER — HYDROCODONE-ACETAMINOPHEN 5-325 MG PO TABS: 1.0000 | ORAL_TABLET | ORAL | 0 refills | Status: DC | PRN

## 2018-02-21 MED ORDER — ONDANSETRON HCL 4 MG PO TABS: 4.0000 mg | ORAL_TABLET | Freq: Three times a day (TID) | ORAL | 1 refills | Status: DC | PRN

## 2018-02-21 MED ORDER — ACETAMINOPHEN 325 MG PO TABS: 650.0000 mg | ORAL_TABLET | Freq: Four times a day (QID) | ORAL | Status: DC | PRN

## 2018-02-21 MED ORDER — HEPARIN (PORCINE) IN NACL 100-0.45 UNIT/ML-% IJ SOLN
1850.0000 [IU]/h | INTRAMUSCULAR | Status: AC
  Filled 2018-02-21: qty 250

## 2018-02-21 MED ORDER — CALCIUM CARBONATE ANTACID 500 MG PO CHEW
1.0000 | CHEWABLE_TABLET | Freq: Four times a day (QID) | ORAL | Status: DC | PRN
  Administered 2018-02-21: 200 mg via ORAL
  Filled 2018-02-21: qty 1

## 2018-02-21 MED ORDER — APIXABAN 5 MG PO TABS
5.0000 mg | ORAL_TABLET | Freq: Two times a day (BID) | ORAL | Status: DC
  Administered 2018-02-21: 5 mg via ORAL
  Filled 2018-02-21: qty 1

## 2018-02-21 MED ORDER — CLINDAMYCIN HCL 300 MG PO CAPS: 300.0000 mg | ORAL_CAPSULE | Freq: Three times a day (TID) | ORAL | 0 refills | Status: AC

## 2018-02-21 NOTE — Progress Notes (Signed)
ANTICOAGULATION CONSULT NOTE - Consult  Pharmacy Consult for heparin Indication: DVT  Allergies  Allergen Reactions  . Penicillins Other (See Comments)    Unknown Has patient had a PCN reaction causing immediate rash, facial/tongue/throat swelling, SOB or lightheadedness with hypotension: Unknown Has patient had a PCN reaction causing severe rash involving mucus membranes or skin necrosis: Unknown Has patient had a PCN reaction that required hospitalization: Unknown Has patient had a PCN reaction occurring within the last 10 years: No If all of the above answers are "NO", then may proceed with Cephalosporin use.  . Tetracycline Other (See Comments)    Hair grows on tongue  . Penicillin G Rash    Has patient had a PCN reaction causing immediate rash, facial/tongue/throat swelling, SOB or lightheadedness with hypotension: Unknown Has patient had a PCN reaction causing severe rash involving mucus membranes or skin necrosis: Unknown Has patient had a PCN reaction that required hospitalization: Unknown Has patient had a PCN reaction occurring within the last 10 years: No If all of the above answers are "NO", then may proceed with Cephalosporin use.    Patient Measurements: Height: 6\' 4"  (193 cm) Weight: 265 lb (120.2 kg) IBW/kg (Calculated) : 86.8 Heparin Dosing Weight: 112 kg  Vital Signs: Temp: 98.1 F (36.7 C) (11/03 0543) Temp Source: Oral (11/03 0543) BP: 115/75 (11/03 0543) Pulse Rate: 89 (11/03 0543)  Labs: Recent Labs    02/20/18 1021 02/20/18 1810 02/20/18 2331 02/21/18 0556 02/21/18 0815  HGB 15.2  --   --  12.7*  --   HCT 49.1  --   --  40.1  --   PLT 144*  --   --  129*  --   APTT  --  136* 91*  --   --   LABPROT  --  15.9*  --   --   --   INR  --  1.28  --   --   --   HEPARINUNFRC  --  0.88* 0.56  --  0.30  CREATININE 1.05  --   --  1.01  --     Estimated Creatinine Clearance: 113 mL/min (by C-G formula based on SCr of 1.01 mg/dL).   Medical  History: Past Medical History:  Diagnosis Date  . Basal cell carcinoma    Nose  . Dysuria   . History of kidney stones   . Hypertension   . Hypogonadism in male   . OSA on CPAP   . Pituitary insufficiency ([place])   . Pre-diabetes   . Umbilical hernia 33/82/5053  . Wears glasses    Assessment: Pharmacy consulted to dose and monitor heparin drip in this 58 year old male. Pt was recently diagnosed with LE DVT several weeks and started on apixaban. He is currently on apixaban 5 mg PO BID with last dose taken 11/1 ~22:30.  Pt presented to ED with LE swelling/erythema, increased pain. Concern for infection vs. Worsening occlusion.   Today, 02/21/18  Hgb 12.7, Plt 129 have decreased from yesterday. No signs/symptoms of bleeding per RN. Messaged MD regarding Hgb drop.   Pt was taking apixaban PTA, but HL and aPTT were correlating, so monitoring using HL only.   HL = 0.30 is therapeutic on heparin infusion of 1800 units/hr but on the low end of range.   Spoke with RN - no issues with heparin infusion, no signs/symptoms of bleeding  Goal of Therapy:  Heparin level 0.3-0.7 units/ml Monitor platelets by anticoagulation protocol: Yes   Plan:  Slightly increase heparin drip to 1850 units/hr Check 6 hour HL. Will recheck CBC at that time as well. CBC and HL daily  Continue to monitor H&H and platelets Monitor for signs/symptoms of bleeding  Lenis Noon, PharmD Clinical Pharmacist 02/21/2018,10:59 AM

## 2018-02-21 NOTE — Progress Notes (Signed)
ANTICOAGULATION CONSULT NOTE - Consult  Pharmacy Consult for heparin Indication: DVT  Allergies  Allergen Reactions  . Penicillins Other (See Comments)    Unknown Has patient had a PCN reaction causing immediate rash, facial/tongue/throat swelling, SOB or lightheadedness with hypotension: Unknown Has patient had a PCN reaction causing severe rash involving mucus membranes or skin necrosis: Unknown Has patient had a PCN reaction that required hospitalization: Unknown Has patient had a PCN reaction occurring within the last 10 years: No If all of the above answers are "NO", then may proceed with Cephalosporin use.  . Tetracycline Other (See Comments)    Hair grows on tongue  . Penicillin G Rash    Has patient had a PCN reaction causing immediate rash, facial/tongue/throat swelling, SOB or lightheadedness with hypotension: Unknown Has patient had a PCN reaction causing severe rash involving mucus membranes or skin necrosis: Unknown Has patient had a PCN reaction that required hospitalization: Unknown Has patient had a PCN reaction occurring within the last 10 years: No If all of the above answers are "NO", then may proceed with Cephalosporin use.    Patient Measurements: Height: 6\' 4"  (193 cm) Weight: 265 lb (120.2 kg) IBW/kg (Calculated) : 86.8 Heparin Dosing Weight: 109 kg  Vital Signs: Temp: 98.7 F (37.1 C) (11/02 2054) Temp Source: Oral (11/02 2054) BP: 129/74 (11/02 2054) Pulse Rate: 90 (11/02 2131)  Labs: Recent Labs    02/20/18 1021 02/20/18 1810 02/20/18 2331  HGB 15.2  --   --   HCT 49.1  --   --   PLT 144*  --   --   APTT  --  136* 91*  LABPROT  --  15.9*  --   INR  --  1.28  --   HEPARINUNFRC  --  0.88* 0.56  CREATININE 1.05  --   --     Estimated Creatinine Clearance: 108.7 mL/min (by C-G formula based on SCr of 1.05 mg/dL).   Medical History: Past Medical History:  Diagnosis Date  . Basal cell carcinoma    Nose  . Dysuria   . History of kidney  stones   . Hypertension   . Hypogonadism in male   . OSA on CPAP   . Pituitary insufficiency ([place])   . Pre-diabetes   . Umbilical hernia 94/70/9628  . Wears glasses    Assessment: Pharmacy consulted to dose and monitor heparin drip in this 57 year old male. Pt was recently diagnosed with LE DVT ~3 weeks and started on apixaban. He is currently on apixaban 5 mg PO BID with last dose taken 11/1 ~22:30.  Pt presented to ED with LE swelling/erythema, increased pain. Concern for infection vs. Worsening occlusion.    11/2  Hgb 15.2 WNL  Plt 144 slightly low  STAT HL, APTT, and protime-INR ordered. If baseline HL falsely elevated due to apixaban, will need to monitor/adjust using APTT until APTT and HL correlate.  2355 HL= 0.56 and aPtt = 91, both are therapeutic and correlating, no infusion or bleeding issues per RN.     Goal of Therapy:  Heparin level 0.3-0.7 units/ml APTT 66 - 102 seconds Monitor platelets by anticoagulation protocol: Yes   Plan:   Continue heparin drip at 1800 units/hr Recheck HL today to ensure stays in range Continue to monitor H&H and platelets Monitor for signs/symptoms of bleeding  Dorrene German, PharmD Clinical Pharmacist 02/21/2018,12:55 AM

## 2018-02-21 NOTE — Progress Notes (Signed)
Discussed with patient and spouse discharge instructions, both verbalized agreement and understanding.  Patient to leave with all belongings to go home in wheelchair.

## 2018-02-21 NOTE — Discharge Summary (Signed)
Physician Discharge Summary  Andre Morrison YIF:[id number] DOB: 06-Feb-1961 DOA: 02/20/2018  PCP: System, Pcp Not In  Admit date: 02/20/2018 Discharge date: 02/21/2018  Admitted From: home Disposition:  home   Recommendations for Outpatient Follow-up:  1. CBC to f/u platelets Discharge Condition:  stable   CODE STATUS:  Full code   Consultations:  none    Discharge Diagnoses:  Principal Problem:   Left leg cellulitis Active Problems:   Type 2 diabetes mellitus with hyperglycemia (HCC)   Essential hypertension   Sleep apnea   Left leg pain       Brief Summary: Andre Morrison is a 57 y.o. male with medical history significant of depression, diabetes mellitus type 2, hypertension, sleep apnea.  Patient presented secondary to worsening left leg pain, redness, swelling.  Symptoms started last night and worsened this morning.  He took some Norco to help with his pain.  He reports an ulcer developing a few months ago and has seen a podiatrist for management.  His ulcer is nonhealing.  Related to this admission, patient reports that about 1 year ago he had surgery to his left knee and was told by his physician that he would have swelling for at least a year.  He noticed that earlier this year his swelling started to decrease but then picked up again later in the year.  About 3-1/2 to 4 weeks ago, he went to see his vascular surgeon who diagnosed him with an acute DVT of his left leg.  He was started on Eliquis and has been taking it daily.  Hospital Course:  Left leg pain/swelling - Appears consistent with cellulitis - started on Clindamycin due to allergy to PCN (patient could not remember if he had Anaphylaxis!) -  Venous duplex significant for indeterminate age for DVT in his leg.  Patient has been adherent with his Eliquis.    -Vascular surgery initial recommendations: Heparin drip, bedrest, treat as cellulitis and they will evaluate- after evaluation, Dr Early notes that the DVT  likely is chronic related a patellar surgery in 2018 and recommends d/c on oral agen - his swelling and pain has improved today and is able to ambulate today - he wears TEDs on a routine basis and continue these   History of diabetes/prediabetes Last hemoglobin A1c of 6.1% in June 2009.  Patient states his hemoglobin A1c is in the high fives to low 6 range.  Glucose on current metabolic panel is over 867 and patient states he has not eaten since last night.  He has been managing his diabetes with diet control and weight loss -Carb modified diet -SSI used in the hospital  Essential hypertension -Losartan  Mild thrombocytopenia - follow as outpt   Discharge Exam: Vitals:   02/20/18 2131 02/21/18 0543  BP:  115/75  Pulse: 90 89  Resp: 20 18  Temp:  98.1 F (36.7 C)  SpO2: 97% 97%   Vitals:   02/20/18 1628 02/20/18 2054 02/20/18 2131 02/21/18 0543  BP:  129/74  115/75  Pulse:  87 90 89  Resp:  17 20 18   Temp:  98.7 F (37.1 C)  98.1 F (36.7 C)  TempSrc:  Oral  Oral  SpO2:  99% 97% 97%  Weight: 120.2 kg     Height: 6\' 4"  (1.93 m)       General: Pt is alert, awake, not in acute distress Cardiovascular: RRR, S1/S2 +, no rubs, no gallops Respiratory: CTA bilaterally, no wheezing, no rhonchi Abdominal: Soft,  NT, ND, bowel sounds + Extremities:  Edema, erythema and warms of left foot and leg to just below the knee noted   Discharge Instructions  Discharge Instructions    Diet - low sodium heart healthy   Complete by:  As directed    Increase activity slowly   Complete by:  As directed    Place in observation (patient's expected length of stay will be less than 2 midnights)   Complete by:  As directed      Allergies as of 02/21/2018      Reactions   Penicillins Other (See Comments)   Unknown Has patient had a PCN reaction causing immediate rash, facial/tongue/throat swelling, SOB or lightheadedness with hypotension: Unknown Has patient had a PCN reaction causing  severe rash involving mucus membranes or skin necrosis: Unknown Has patient had a PCN reaction that required hospitalization: Unknown Has patient had a PCN reaction occurring within the last 10 years: No If all of the above answers are "NO", then may proceed with Cephalosporin use.   Tetracycline Other (See Comments)   Hair grows on tongue   Penicillin G Rash   Has patient had a PCN reaction causing immediate rash, facial/tongue/throat swelling, SOB or lightheadedness with hypotension: Unknown Has patient had a PCN reaction causing severe rash involving mucus membranes or skin necrosis: Unknown Has patient had a PCN reaction that required hospitalization: Unknown Has patient had a PCN reaction occurring within the last 10 years: No If all of the above answers are "NO", then may proceed with Cephalosporin use.      Medication List    TAKE these medications   acetaminophen 325 MG tablet Commonly known as:  TYLENOL Take 2 tablets (650 mg total) by mouth every 6 (six) hours as needed for mild pain (or Fever >/= 101).   cetirizine 10 MG tablet Commonly known as:  ZYRTEC Take 10 mg by mouth daily.   clindamycin 300 MG capsule Commonly known as:  CLEOCIN Take 1 capsule (300 mg total) by mouth 3 (three) times daily for 10 days.   ELIQUIS 5 MG Tabs tablet Generic drug:  apixaban Take 5 mg by mouth 2 (two) times daily.   HYDROcodone-acetaminophen 5-325 MG tablet Commonly known as:  NORCO/VICODIN Take 1 tablet by mouth every 4 (four) hours as needed for moderate pain.   losartan 25 MG tablet Commonly known as:  COZAAR Take 25 mg by mouth daily.   multivitamin-iron-minerals-folic acid chewable tablet Chew 1 tablet by mouth daily.   ondansetron 4 MG tablet Commonly known as:  ZOFRAN Take 1 tablet (4 mg total) by mouth every 8 (eight) hours as needed for nausea or vomiting. What changed:  when to take this   tadalafil 5 MG tablet Commonly known as:  CIALIS Take 5 mg by mouth  daily as needed for erectile dysfunction.       Allergies  Allergen Reactions  . Penicillins Other (See Comments)    Unknown Has patient had a PCN reaction causing immediate rash, facial/tongue/throat swelling, SOB or lightheadedness with hypotension: Unknown Has patient had a PCN reaction causing severe rash involving mucus membranes or skin necrosis: Unknown Has patient had a PCN reaction that required hospitalization: Unknown Has patient had a PCN reaction occurring within the last 10 years: No If all of the above answers are "NO", then may proceed with Cephalosporin use.  . Tetracycline Other (See Comments)    Hair grows on tongue  . Penicillin G Rash    Has patient  had a PCN reaction causing immediate rash, facial/tongue/throat swelling, SOB or lightheadedness with hypotension: Unknown Has patient had a PCN reaction causing severe rash involving mucus membranes or skin necrosis: Unknown Has patient had a PCN reaction that required hospitalization: Unknown Has patient had a PCN reaction occurring within the last 10 years: No If all of the above answers are "NO", then may proceed with Cephalosporin use.     Procedures/Studies:    Vas [place] Lower Extremity Venous (dvt) (only [facility])  Result Date: 02/21/2018  Lower Venous Study Indications: Edema.  Limitations: Body habitus, poor ultrasound/tissue interface and patient pain tolerance. Performing Technologist: Oliver Hum RVT  Examination Guidelines: A complete evaluation includes B-mode imaging, spectral Doppler, color Doppler, and power Doppler as needed of all accessible portions of each vessel. Bilateral testing is considered an integral part of a complete examination. Limited examinations for reoccurring indications may be performed as noted.  Right Venous Findings: +---+---------------+---------+-----------+----------+-------+    CompressibilityPhasicitySpontaneityPropertiesSummary  +---+---------------+---------+-----------+----------+-------+ CFVFull           Yes      Yes                          +---+---------------+---------+-----------+----------+-------+  Left Venous Findings: +---------+---------------+---------+-----------+----------+-----------------+          CompressibilityPhasicitySpontaneityPropertiesSummary           +---------+---------------+---------+-----------+----------+-----------------+ CFV      Full           Yes      Yes                                    +---------+---------------+---------+-----------+----------+-----------------+ SFJ      Full                                                           +---------+---------------+---------+-----------+----------+-----------------+ FV Prox  Full                                                           +---------+---------------+---------+-----------+----------+-----------------+ FV Mid   Partial        Yes      Yes                  Age Indeterminate +---------+---------------+---------+-----------+----------+-----------------+ FV DistalPartial                                                        +---------+---------------+---------+-----------+----------+-----------------+ PFV      Full                                                           +---------+---------------+---------+-----------+----------+-----------------+ POP      Partial  No       No                   Age Indeterminate +---------+---------------+---------+-----------+----------+-----------------+ PTV      Full                                                           +---------+---------------+---------+-----------+----------+-----------------+ PERO     Full                                                           +---------+---------------+---------+-----------+----------+-----------------+    Summary: Right: There is no evidence of deep vein thrombosis in the lower  extremity. Left: Findings consistent with age indeterminate deep vein thrombosis involving the left femoral vein, and left popliteal vein. No cystic structure found in the popliteal fossa.  *See table(s) above for measurements and observations. Electronically signed by Curt Jews MD on 02/21/2018 at 8:49:04 AM.    Final      The results of significant diagnostics from this hospitalization (including imaging, microbiology, ancillary and laboratory) are listed below for reference.     Microbiology: No results found for this or any previous visit (from the past 240 hour(s)).   Labs: BNP (last 3 results) No results for input(s): BNP in the last 8760 hours. Basic Metabolic Panel: Recent Labs  Lab 02/20/18 1021 02/21/18 0556  NA 139 138  K 4.4 3.6  CL 104 105  CO2 21* 24  GLUCOSE 205* 167*  BUN 16 19  CREATININE 1.05 1.01  CALCIUM 9.3 8.6*   Liver Function Tests: Recent Labs  Lab 02/20/18 1021  AST 29  ALT 27  ALKPHOS 61  BILITOT 1.8*  PROT 7.5  ALBUMIN 4.5   No results for input(s): LIPASE, AMYLASE in the last 168 hours. No results for input(s): AMMONIA in the last 168 hours. CBC: Recent Labs  Lab 02/20/18 1021 02/21/18 0556  WBC 9.2 8.5  NEUTROABS 7.5  --   HGB 15.2 12.7*  HCT 49.1 40.1  MCV 99.0 96.6  PLT 144* 129*   Cardiac Enzymes: No results for input(s): CKTOTAL, CKMB, CKMBINDEX, TROPONINI in the last 168 hours. BNP: Invalid input(s): POCBNP CBG: Recent Labs  Lab 02/20/18 1726 02/20/18 2057 02/21/18 0732 02/21/18 1128  GLUCAP 137* 231* 159* 177*   D-Dimer No results for input(s): DDIMER in the last 72 hours. Hgb A1c Recent Labs    02/21/18 0556  HGBA1C 6.2*   Lipid Profile No results for input(s): CHOL, HDL, LDLCALC, TRIG, CHOLHDL, LDLDIRECT in the last 72 hours. Thyroid function studies No results for input(s): TSH, T4TOTAL, T3FREE, THYROIDAB in the last 72 hours.  Invalid input(s): FREET3 Anemia work up No results for input(s):  VITAMINB12, FOLATE, FERRITIN, TIBC, IRON, RETICCTPCT in the last 72 hours. Urinalysis No results found for: COLORURINE, APPEARANCEUR, LABSPEC, [place], GLUCOSEU, HGBUR, BILIRUBINUR, KETONESUR, PROTEINUR, UROBILINOGEN, NITRITE, LEUKOCYTESUR Sepsis Labs Invalid input(s): PROCALCITONIN,  WBC,  LACTICIDVEN Microbiology No results found for this or any previous visit (from the past 240 hour(s)).   Time coordinating discharge in minutes: 55  SIGNED:   Debbe Odea, MD  [facility] 02/21/2018,  1:21 PM Pager   If 7PM-7AM, please contact night-coverage [email or web address] Password [email or web address]

## 2018-02-24 DIAGNOSIS — I8312 Varicose veins of left lower extremity with inflammation: Secondary | ICD-10-CM | POA: Diagnosis not present

## 2018-02-24 DIAGNOSIS — I872 Venous insufficiency (chronic) (peripheral): Secondary | ICD-10-CM | POA: Diagnosis not present

## 2018-02-24 DIAGNOSIS — Z85828 Personal history of other malignant neoplasm of skin: Secondary | ICD-10-CM | POA: Diagnosis not present

## 2018-02-24 DIAGNOSIS — I8311 Varicose veins of right lower extremity with inflammation: Secondary | ICD-10-CM | POA: Diagnosis not present

## 2018-03-09 DIAGNOSIS — L03116 Cellulitis of left lower limb: Secondary | ICD-10-CM | POA: Diagnosis not present

## 2018-03-09 DIAGNOSIS — I83223 Varicose veins of left lower extremity with both ulcer of ankle and inflammation: Secondary | ICD-10-CM | POA: Diagnosis not present

## 2018-03-23 ENCOUNTER — Other Ambulatory Visit: Payer: Self-pay

## 2018-03-23 ENCOUNTER — Ambulatory Visit (INDEPENDENT_AMBULATORY_CARE_PROVIDER_SITE_OTHER): Payer: BLUE CROSS/BLUE SHIELD | Admitting: Vascular Surgery

## 2018-03-23 ENCOUNTER — Encounter: Payer: Self-pay | Admitting: Vascular Surgery

## 2018-03-23 VITALS — BP 153/87 | HR 71 | Temp 97.7°F | Resp 16 | Ht 76.0 in | Wt 279.0 lb

## 2018-03-23 DIAGNOSIS — I82402 Acute embolism and thrombosis of unspecified deep veins of left lower extremity: Secondary | ICD-10-CM

## 2018-03-23 NOTE — Progress Notes (Signed)
[facility] [place]  Patient name: Andre Morrison MRN: [id number] DOB: Sep 21, 1960 Sex: male  REASON FOR VISIT: Discuss left leg DVT  HPI: Andre Morrison is a 57 y.o. male here today for follow-up.  I had seen him in consultation when he was admitted to [facility] long hospital 1 month ago.  He had been having leg swelling and was diagnosed with a left femoral vein and popliteal vein DVT.  This appears to be chronic in nature.  Most likely this was related to past knee surgery.  There was no ultrasound around this time to confirm this but he reports leg swelling occurring immediately thereafter.  He did have a long history of venous pathology and had what sounds like varicose vein removal in his late teens.  He had presented with cellulitis in the face of venous stasis changes in his calf and also a ulceration below the left medial malleolus.  This responded to antibiotics.  He had seen [facility] and there was some discussion about referral to [facility] for more extensive treatment.  Past Medical History:  Diagnosis Date  . Basal cell carcinoma    Nose  . Dysuria   . History of kidney stones   . Hypertension   . Hypogonadism in male   . OSA on CPAP   . Pituitary insufficiency ([place])   . Pre-diabetes   . Umbilical hernia 95/63/8756  . Wears glasses     Family History  Problem Relation Age of Onset  . Liver cancer Father     SOCIAL HISTORY: Social History   Tobacco Use  . Smoking status: Never Smoker  . Smokeless tobacco: Never Used  Substance Use Topics  . Alcohol use: Yes    Alcohol/week: 1.0 standard drinks    Types: 1 Glasses of wine per week    Comment: every other day    Allergies  Allergen Reactions  . Penicillins Other (See Comments)    Unknown Has patient had a PCN reaction causing immediate rash, facial/tongue/throat swelling, SOB or lightheadedness with hypotension: Unknown Has patient had a PCN  reaction causing severe rash involving mucus membranes or skin necrosis: Unknown Has patient had a PCN reaction that required hospitalization: Unknown Has patient had a PCN reaction occurring within the last 10 years: No If all of the above answers are "NO", then may proceed with Cephalosporin use.  . Tetracycline Other (See Comments)    Hair grows on tongue  . Penicillin G Rash    Has patient had a PCN reaction causing immediate rash, facial/tongue/throat swelling, SOB or lightheadedness with hypotension: Unknown Has patient had a PCN reaction causing severe rash involving mucus membranes or skin necrosis: Unknown Has patient had a PCN reaction that required hospitalization: Unknown Has patient had a PCN reaction occurring within the last 10 years: No If all of the above answers are "NO", then may proceed with Cephalosporin use.    Current Outpatient Medications  Medication Sig Dispense Refill  . acetaminophen (TYLENOL) 325 MG tablet Take 2 tablets (650 mg total) by mouth every 6 (six) hours as needed for mild pain (or Fever >/= 101).    Marland Kitchen apixaban (ELIQUIS) 5 MG TABS tablet Take 5 mg by mouth 2 (two) times daily.    . cetirizine (ZYRTEC) 10 MG tablet Take 10 mg by mouth daily.    Marland Kitchen HYDROcodone-acetaminophen (NORCO) 5-325 MG tablet Take 1 tablet by mouth every 4 (four) hours as needed for moderate pain. 8  tablet 0  . losartan (COZAAR) 25 MG tablet Take 25 mg by mouth daily.     . multivitamin-iron-minerals-folic acid (CENTRUM) chewable tablet Chew 1 tablet by mouth daily.    . ondansetron (ZOFRAN) 4 MG tablet Take 1 tablet (4 mg total) by mouth every 8 (eight) hours as needed for nausea or vomiting. 30 tablet 1  . tadalafil (CIALIS) 5 MG tablet Take 5 mg by mouth daily as needed for erectile dysfunction.   0   No current facility-administered medications for this visit.     REVIEW OF SYSTEMS:  [X]  denotes positive finding, [ ]  denotes negative finding Cardiac  Comments:  Chest pain or  chest pressure:    Shortness of breath upon exertion:    Short of breath when lying flat:    Irregular heart rhythm:        Vascular    Pain in calf, thigh, or hip brought on by ambulation:    Pain in feet at night that wakes you up from your sleep:     Blood clot in your veins: x   Leg swelling:  x         PHYSICAL EXAM: Vitals:   03/23/18 1415  BP: (!) 153/87  Pulse: 71  Resp: 16  Temp: 97.7 F (36.5 C)  TempSrc: Oral  SpO2: 95%  Weight: 279 lb (126.6 kg)  Height: 6\' 4"  (1.93 m)    GENERAL: The patient is a well-nourished male, in no acute distress. The vital signs are documented above. CARDIOVASCULAR: Palpable dorsalis pedis pulses bilaterally.  [place] deposits circumferentially in both legs from his calf to his ankles more so on the left than on the right PULMONARY: There is good air exchange  MUSCULOSKELETAL: There are no major deformities or cyanosis. NEUROLOGIC: No focal weakness or paresthesias are detected. SKIN: Epithelialized ulcer on the medial left ankle below the malleolus PSYCHIATRIC: The patient has a normal affect.  DATA:  Duplex from last month reviewed showing chronic clot in his femoral vein and popliteal vein on the left  MEDICAL ISSUES: I discussed this again at length with the patient.  Feel that it is safe for him to discontinue his anticoagulation after the 3 months treatment.  In all likelihood this was present for quite some time and he has had 3 months of anticoagulation treatment.  Would recommend resumption of anticoagulation only for new clot.  I did explain the critical importance of compression garments.  Also explained the risk for recurrent episodes of cellulitis.  Would recommend beginning oral antibiotics if he has any episodes of fever or worsening erythema.  He does have a question of penicillin allergy as a child.  May still be able to use cephalosporin.  Would recommend Keflex orally for any recurrent cellulitis.  He will discuss this  with Dr. Moreen Fowler for further evaluation to determine if this is safe.  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  He was reassured with this discussion will see [place] again on an as-needed basis    Rosetta Posner, MD [facility] Tel [phone] Pager [phone]

## 2018-05-17 ENCOUNTER — Encounter: Payer: Self-pay | Admitting: Allergy and Immunology

## 2018-05-17 ENCOUNTER — Ambulatory Visit (INDEPENDENT_AMBULATORY_CARE_PROVIDER_SITE_OTHER): Payer: BLUE CROSS/BLUE SHIELD | Admitting: Allergy and Immunology

## 2018-05-17 VITALS — BP 120/76 | HR 68 | Temp 98.5°F | Resp 20 | Ht 76.0 in | Wt 282.4 lb

## 2018-05-17 DIAGNOSIS — Z880 Allergy status to penicillin: Secondary | ICD-10-CM

## 2018-05-17 NOTE — Progress Notes (Signed)
New Patient Note  RE: Andre Morrison MRN: [id number] DOB: 05-Apr-1961 Date of Office Visit: 05/17/2018  Referring provider: Antony Contras, MD Primary care provider: Antony Contras, MD  Chief Complaint: Medication Reaction and Allergy Testing   History of present illness: Andre Morrison" [name] is a 58 y.o. male seen today in consultation requested by Antony Contras, MD.  He reports that his mother told him that he had allergic reaction to penicillin as a child.  His mother is no longer living and he does not know details of the reaction that made his mother believe that he had a penicillin allergy, however he has avoided penicillins throughout his life.  Over this past year he was diagnosed with cellulitis and was treated with clindamycin.  He is interested in assessing his penicillin allergy status. He states that he is allergic to tetracycline because when he has taken it in the past he "grows hair" on his tongue.  The patient experiences mild nasal allergy and sinus symptoms when he spends prolonged periods of time outdoors in the springtime.  He has been on aero allergen immunotherapy in the past.  He is able to control his nasal/sinus symptoms with over-the-counter antihistamines.  Assessment and plan: History of penicillin allergy The patient has a low/moderate probability history for penicillin allergy and had negative skin tests to Pre-Pen and pen G.  Russ was able to tolerate the amoxicillin oral challenge today without adverse signs or symptoms. Vital signs were stable throughout the challenge and observation period. Therefore, Delsa Bern has the same risk of systemic reaction associated with penicillin as the general population.  He has been asked to contact me or seek medical attention if untoward symptoms arise over the next 24-48 hours.   Diagnostics: PenG skin testing: Negative percutaneous and intradermal tests at all dilutions despite a positive histamine control. PrePen skin  testing: Negative percutaneous and intradermal tests at all dilutions despite a positive histamine control. Open penicillin challenge: The patient was able to tolerate the challenge today without adverse signs or symptoms. Vital signs were stable throughout the challenge and observation period.   Physical examination: Blood pressure 120/76, pulse 68, temperature 98.5 F (36.9 C), temperature source Oral, resp. rate 20, height 6\' 4"  (1.93 m), weight 282 lb 6.4 oz (128.1 kg), SpO2 96 %.  General: Alert, interactive, in no acute distress. HEENT: TMs pearly gray, turbinates minimally edematous without discharge, post-pharynx unremarkable. Neck: Supple without lymphadenopathy. Lungs: Clear to auscultation without wheezing, rhonchi or rales. CV: Normal S1, S2 without murmurs. Abdomen: Nondistended, nontender. Skin: Warm and dry, without lesions or rashes. Extremities:  No clubbing, cyanosis or edema. Neuro:   Grossly intact.  Review of systems:  Review of systems negative except as noted in HPI / PMHx or noted below: Review of Systems  Constitutional: Negative.   HENT: Negative.   Eyes: Negative.   Respiratory: Negative.   Cardiovascular: Negative.   Gastrointestinal: Negative.   Genitourinary: Negative.   Musculoskeletal: Negative.   Skin: Negative.   Neurological: Negative.   Endo/Heme/Allergies: Negative.   Psychiatric/Behavioral: Negative.     Past medical history:  Past Medical History:  Diagnosis Date  . Basal cell carcinoma    Nose  . Dysuria   . History of kidney stones   . Hypertension   . Hypogonadism in male   . OSA on CPAP   . Pituitary insufficiency ([place])   . Pre-diabetes   . Umbilical hernia 26/83/4196  . Wears glasses     Past  surgical history:  Past Surgical History:  Procedure Laterality Date  . COLONOSCOPY     x2  . CYSTOSCOPY W/ URETERAL STENT PLACEMENT Bilateral 12/11/2017   Procedure: CYSTOSCOPY WITH RETROGRADE PYELOGRAM/URETERAL STENT PLACEMENT;   Surgeon: Bjorn Loser, MD;  Location: [facility];  Service: Urology;  Laterality: Bilateral;  . CYSTOSCOPY/URETEROSCOPY/HOLMIUM LASER/STENT PLACEMENT Bilateral 01/08/2018   Procedure: CYSTOSCOPY/URETEROSCOPY/HOLMIUM LASER/STENT REMOVAL AND STENT PLACEMENT, STONE BASKET EXTRACTION;  Surgeon: Ceasar Mons, MD;  Location: [facility];  Service: Urology;  Laterality: Bilateral;  . EXTRACORPOREAL SHOCK WAVE LITHOTRIPSY Left 04-30-2009    dr Terance Hart  . PATELLECTOMY Left 06-27-2016   dr Erlinda Hong   partial  . UMBILICAL HERNIA REPAIR  07-12-2003   dr Bubba Camp   incarcerated  . VARICOSE VEIN SURGERY Left 1976    Family history: Family History  Problem Relation Age of Onset  . Liver cancer Father   . Allergic rhinitis Neg Hx   . Angioedema Neg Hx   . Asthma Neg Hx   . Eczema Neg Hx   . Urticaria Neg Hx     Social history: Social History   Socioeconomic History  . Marital status: Married    Spouse name: Not on file  . Number of children: Not on file  . Years of education: Not on file  . Highest education level: Not on file  Occupational History  . Not on file  Social Needs  . Financial resource strain: Not on file  . Food insecurity:    Worry: Not on file    Inability: Not on file  . Transportation needs:    Medical: Not on file    Non-medical: Not on file  Tobacco Use  . Smoking status: Never Smoker  . Smokeless tobacco: Never Used  Substance and Sexual Activity  . Alcohol use: Yes    Alcohol/week: 1.0 standard drinks    Types: 1 Glasses of wine per week    Comment: every other day  . Drug use: Never  . Sexual activity: Not on file  Lifestyle  . Physical activity:    Days per week: Not on file    Minutes per session: Not on file  . Stress: Not on file  Relationships  . Social connections:    Talks on phone: Not on file    Gets together: Not on file    Attends religious service: Not on file    Active member of club or  organization: Not on file    Attends meetings of clubs or organizations: Not on file    Relationship status: Not on file  . Intimate partner violence:    Fear of current or ex partner: Not on file    Emotionally abused: Not on file    Physically abused: Not on file    Forced sexual activity: Not on file  Other Topics Concern  . Not on file  Social History Narrative  . Not on file   Environmental History: The patient lives in a 59 year old house with carpeting the bedroom, gas heat, and central air.  There is no known mold/water damage in the home.  He is a non-smoker without pets.  Allergies as of 05/17/2018      Reactions   Tetracycline Other (See Comments)   Hair grows on tongue      Medication List       Accurate as of May 17, 2018  8:36 PM. Always use your most recent med list.  acetaminophen 325 MG tablet Commonly known as:  TYLENOL Take 2 tablets (650 mg total) by mouth every 6 (six) hours as needed for mild pain (or Fever >/= 101).   cetirizine 10 MG tablet Commonly known as:  ZYRTEC Take 10 mg by mouth daily.   ELIQUIS 5 MG Tabs tablet Generic drug:  apixaban Take 5 mg by mouth 2 (two) times daily.   losartan 25 MG tablet Commonly known as:  COZAAR Take 25 mg by mouth daily.   multivitamin-iron-minerals-folic acid chewable tablet Chew 1 tablet by mouth daily.   tadalafil 10 MG tablet Commonly known as:  CIALIS       Known medication allergies: Allergies  Allergen Reactions  . Tetracycline Other (See Comments)    Hair grows on tongue    I appreciate the opportunity to take part in Russ's care. Please do not hesitate to contact me with questions.  Sincerely,   R. Edgar Frisk, MD

## 2018-05-17 NOTE — Patient Instructions (Addendum)
History of penicillin allergy The patient has a low/moderate probability history for penicillin allergy and had negative skin tests to Pre-Pen and pen G.  Andre Morrison was able to tolerate the amoxicillin oral challenge today without adverse signs or symptoms. Vital signs were stable throughout the challenge and observation period. Therefore, Andre Morrison has the same risk of systemic reaction associated with penicillin as the general population.  He has been asked to contact me or seek medical attention if untoward symptoms arise over the next 24-48 hours.   Follow up if needed.

## 2018-05-17 NOTE — Assessment & Plan Note (Signed)
The patient has a low/moderate probability history for penicillin allergy and had negative skin tests to Pre-Pen and pen G.  Russ was able to tolerate the amoxicillin oral challenge today without adverse signs or symptoms. Vital signs were stable throughout the challenge and observation period. Therefore, Delsa Bern has the same risk of systemic reaction associated with penicillin as the general population.  He has been asked to contact me or seek medical attention if untoward symptoms arise over the next 24-48 hours.

## 2018-12-14 ENCOUNTER — Ambulatory Visit (INDEPENDENT_AMBULATORY_CARE_PROVIDER_SITE_OTHER): Payer: BC Managed Care – PPO | Admitting: Orthopaedic Surgery

## 2018-12-14 ENCOUNTER — Encounter: Payer: Self-pay | Admitting: Orthopaedic Surgery

## 2018-12-14 VITALS — Ht 76.0 in | Wt 270.0 lb

## 2018-12-14 DIAGNOSIS — S82032D Displaced transverse fracture of left patella, subsequent encounter for closed fracture with routine healing: Secondary | ICD-10-CM | POA: Diagnosis not present

## 2018-12-14 NOTE — Progress Notes (Signed)
Office Visit Note   Patient: Andre Morrison           Date of Birth: [birth year]           MRN: [id number] Visit Date: 12/14/2018              Requested by: Antony Contras, MD [place],   [id number] PCP: Antony Contras, MD   Assessment & Plan: Visit Diagnoses:  1. Closed displaced transverse fracture of left patella with routine healing, subsequent encounter     Plan: From the left knee standpoint there are no changes to his permanent partial impairment that I originally assigned.  As to the question of whether the DVT is related to the patella fracture I defer this to the vascular specialist as the 2 events were separated by over a year.  Patient has chronic venous stasis which in my opinion is the risk factor for developing a DVT anyways.  Questions encouraged and answered.  Follow-up as needed. Total face to face encounter time was greater than 25 minutes and over half of this time was spent in counseling and/or coordination of care.  Follow-Up Instructions: Return if symptoms worsen or fail to improve.   Orders:  No orders of the defined types were placed in this encounter.  No orders of the defined types were placed in this encounter.     Procedures: No procedures performed   Clinical Data: No additional findings.   Subjective: Chief Complaint  Patient presents with  . Left Knee - Follow-up    Andre Morrison comes in today for follow-up of his work-related left patella fracture that we treated approximately 2 years ago.  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  He states that he was diagnosed by vascular specialist sometime in 2019 with a left leg DVT and he did develop some post thrombotic symptoms of the left leg with dermatitis and chronic swelling.  He wears compression socks chronically.  In terms of his knee is  doing well.  He denies any pain.   Review of Systems   Objective: Vital Signs: Ht 6\' 4"  (1.93 m)   Wt 270 lb (122.5 kg)   BMI 32.87 kg/m   Physical Exam  Ortho Exam Left knee exam shows a fully healed surgical scar.  She has excellent range of motion and strength.  He does have venous stasis dermatitis of his left lower leg and chronic pitting edema. Specialty Comments:  No specialty comments available.  Imaging: No results found.   PMFS History: Patient Active Problem List   Diagnosis Date Noted  . History of penicillin allergy 05/17/2018  . Left leg cellulitis 02/21/2018  . Nondisplaced fracture of distal end of right radius 09/25/2016  . Displaced transverse fracture of left patella, subsequent encounter for closed fracture with delayed healing 06/20/2016  . Left patella fracture 06/20/2016  . UMBILICAL HERNIA, HX OF [id number]  . PITUITARY INSUFFICIENCY 10/12/2007  . HYPOGONADISM, MALE 10/12/2007  . Sleep apnea 10/12/2007  . Type 2 diabetes mellitus with hyperglycemia ([place]) 06/07/2007  . Essential hypertension 06/07/2007  . DEPRESSION 10/06/2006  . OSTEOPOROSIS 10/06/2006   Past Medical History:  Diagnosis Date  . Basal cell carcinoma    Nose  . Dysuria   . History of kidney stones   . Hypertension   . Hypogonadism in male   .  OSA on CPAP   . Pituitary insufficiency ([place])   . Pre-diabetes   . Umbilical hernia [id number]  . Wears glasses     Family History  Problem Relation Age of Onset  . Liver cancer Father   . Allergic rhinitis Neg Hx   . Angioedema Neg Hx   . Asthma Neg Hx   . Eczema Neg Hx   . Urticaria Neg Hx     Past Surgical History:  Procedure Laterality Date  . COLONOSCOPY     x2  . CYSTOSCOPY W/ URETERAL STENT PLACEMENT Bilateral 12/11/2017   Procedure: CYSTOSCOPY WITH RETROGRADE PYELOGRAM/URETERAL STENT PLACEMENT;  Surgeon: Bjorn Loser, MD;  Location: [facility];  Service: Urology;  Laterality: Bilateral;  .  CYSTOSCOPY/URETEROSCOPY/HOLMIUM LASER/STENT PLACEMENT Bilateral 01/08/2018   Procedure: CYSTOSCOPY/URETEROSCOPY/HOLMIUM LASER/STENT REMOVAL AND STENT PLACEMENT, STONE BASKET EXTRACTION;  Surgeon: Ceasar Mons, MD;  Location: [facility];  Service: Urology;  Laterality: Bilateral;  . EXTRACORPOREAL SHOCK WAVE LITHOTRIPSY Left 04-30-2009    dr Terance Hart  . PATELLECTOMY Left 06-27-2016   dr Erlinda Hong   partial  . UMBILICAL HERNIA REPAIR  07-12-2003   dr Bubba Camp   incarcerated  . VARICOSE VEIN SURGERY Left 1976   Social History   Occupational History  . Not on file  Tobacco Use  . Smoking status: Never Smoker  . Smokeless tobacco: Never Used  Substance and Sexual Activity  . Alcohol use: Yes    Alcohol/week: 1.0 standard drinks    Types: 1 Glasses of wine per week    Comment: every other day  . Drug use: Never  . Sexual activity: Not on file

## 2019-01-31 ENCOUNTER — Encounter: Payer: Self-pay | Admitting: Orthopaedic Surgery

## 2019-06-20 ENCOUNTER — Encounter (INDEPENDENT_AMBULATORY_CARE_PROVIDER_SITE_OTHER): Payer: Self-pay

## 2019-06-29 ENCOUNTER — Ambulatory Visit (INDEPENDENT_AMBULATORY_CARE_PROVIDER_SITE_OTHER): Payer: BC Managed Care – PPO | Admitting: Bariatrics

## 2019-06-29 ENCOUNTER — Other Ambulatory Visit: Payer: Self-pay

## 2019-06-29 ENCOUNTER — Encounter (INDEPENDENT_AMBULATORY_CARE_PROVIDER_SITE_OTHER): Payer: Self-pay | Admitting: Bariatrics

## 2019-06-29 VITALS — BP 159/82 | HR 69 | Temp 98.4°F | Ht 75.0 in | Wt 279.0 lb

## 2019-06-29 DIAGNOSIS — Z6835 Body mass index (BMI) 35.0-35.9, adult: Secondary | ICD-10-CM

## 2019-06-29 DIAGNOSIS — Z9189 Other specified personal risk factors, not elsewhere classified: Secondary | ICD-10-CM

## 2019-06-29 DIAGNOSIS — I10 Essential (primary) hypertension: Secondary | ICD-10-CM

## 2019-06-29 DIAGNOSIS — Z0289 Encounter for other administrative examinations: Secondary | ICD-10-CM

## 2019-06-29 DIAGNOSIS — R5383 Other fatigue: Secondary | ICD-10-CM | POA: Diagnosis not present

## 2019-06-29 DIAGNOSIS — R0602 Shortness of breath: Secondary | ICD-10-CM

## 2019-06-29 DIAGNOSIS — Z1331 Encounter for screening for depression: Secondary | ICD-10-CM

## 2019-06-29 DIAGNOSIS — N200 Calculus of kidney: Secondary | ICD-10-CM

## 2019-06-29 DIAGNOSIS — E669 Obesity, unspecified: Secondary | ICD-10-CM

## 2019-06-29 DIAGNOSIS — E1169 Type 2 diabetes mellitus with other specified complication: Secondary | ICD-10-CM

## 2019-06-29 DIAGNOSIS — G4730 Sleep apnea, unspecified: Secondary | ICD-10-CM

## 2019-06-29 DIAGNOSIS — E559 Vitamin D deficiency, unspecified: Secondary | ICD-10-CM

## 2019-06-29 NOTE — Progress Notes (Signed)
Dear Dr. Antony Contras,   Thank you for referring Andre Morrison to our clinic. The following note includes my evaluation and treatment recommendations.  Chief Complaint:   OBESITY Andre Morrison (MR# [id number]) is a 59 y.o. male who presents for evaluation and treatment of obesity and related comorbidities. Current BMI is Body mass index is 32.37 kg/m.Andre Morrison has been struggling with his weight for many years and has been unsuccessful in either losing weight, maintaining weight loss, or reaching his healthy weight goal.  Andre Morrison is currently in the action stage of change and ready to dedicate time achieving and maintaining a healthier weight. Andre Morrison is interested in becoming our patient and working on intensive lifestyle modifications including (but not limited to) diet and exercise for weight loss.  Andre Morrison is indifferent about cooking, but denies obstacles. He has nighttime cravings. He sometimes skips breakfast. He craves salty items and chips. His goal weight is 225 to 235 lbs.  Andre Morrison habits were reviewed today and are as follows: His family eats meals together, he thinks his family will eat healthier with him, his desired weight loss is 54 lbs, he has been heavy since his marriage in 1987, he started gaining weight in 1993, his heaviest weight ever was 315 pounds in 2006, he craves chips and crackers, he snacks frequently in the evenings after 10 p.m., he skips breakfast sometimes, he frequently eats larger portions than normal, he has binge eating behaviors and he struggles with emotional eating.  Depression Screen Andre Morrison's Food and Mood (modified PHQ-9) score was 7.  Depression screen PHQ 2/9 06/29/2019  Decreased Interest 1  Down, Depressed, Hopeless 1  PHQ - 2 Score 2  Altered sleeping 0  Tired, decreased energy 1  Change in appetite 2  Feeling bad or failure about yourself  0  Trouble concentrating 1  Moving slowly or fidgety/restless 1  Suicidal thoughts 0  PHQ-9  Score 7   Subjective:   Other fatigue. Andre Morrison admits to daytime somnolence and denies waking up still tired. Patent has a history of symptoms of daytime fatigue. Andre Morrison generally gets 7-8 hours of sleep per night, and states that he has generally restful sleep. Snoring is not present with CPAP. Apneic episodes are not present. Epworth Sleepiness Score is 8.  SOB (shortness of breath) on exertion. Andre Morrison notes increasing shortness of breath with certain activities and seems to be worsening over time with weight gain. He notes getting out of breath sooner with activity than he used to. This has gotten worse recently. Andre Morrison denies shortness of breath at rest or orthopnea.  Essential hypertension. Andre Morrison is taking Cozaar. Blood pressure is slightly elevated today at 159/82.  BP Readings from Last 3 Encounters:  06/29/19 (!) 159/82  05/17/18 120/76  03/23/18 (!) 153/87   Lab Results  Component Value Date   CREATININE 1.01 02/21/2018   CREATININE 1.05 02/20/2018   CREATININE 1.60 (H) 12/11/2017   Sleep apnea, unspecified type. Andre Morrison wears a CPAP. He reports restful sleep for the most part.  Type 2 diabetes mellitus with obesity ([place]). Andre Morrison is on no medications. He does not check his sugars. He states A1c decreased from 7.0 to 6.4 on 03/16/2019.  Lab Results  Component Value Date   HGBA1C 6.2 (H) 02/21/2018   HGBA1C 6.1 (H) 10/12/2007   HGBA1C 9.5 (H) 10/26/2006   Lab Results  Component Value Date   LDLCALC 78 06/17/2007   CREATININE 1.01 02/21/2018   No results found for: INSULIN  Vitamin D deficiency. Andre Morrison is not taking Vitamin D at this time.  Renal stones. Andre Morrison reports a decreased frequency in renal stones over the last 10 years. He has had multiple episodes.  Depression screening. Andre Morrison had a mildly positive depression screen with a PHQ-9 score of 7.  At risk for heart disease. Andre Morrison is at a higher than average risk for cardiovascular disease due to  hypertension and obesity. Reviewed: no chest pain on exertion, no dyspnea on exertion, and no swelling of ankles.  Assessment/Plan:   Other fatigue. Andre Morrison does not feel that his weight is causing his energy to be lower than it should be. Fatigue may be related to obesity, depression or many other causes. Labs will be ordered, and in the meanwhile, Zhion will focus on self care including making healthy food choices, increasing physical activity and focusing on stress reduction. EKG 12-Lead performed.  SOB (shortness of breath) on exertion. Andre Morrison does feel that he gets out of breath more easily that he used to when he exercises. Andre Morrison shortness of breath appears to be obesity related and exercise induced. He has agreed to work on weight loss and gradually increase exercise to treat his exercise induced shortness of breath. Will continue to monitor closely.  Essential hypertension. Andre Morrison is working on healthy weight loss and exercise to improve blood pressure control. We will watch for signs of hypotension as he continues his lifestyle modifications. He will continue medications as directed. Comprehensive metabolic panel ordered.  Sleep apnea, unspecified type. Intensive lifestyle modifications are the first line treatment for this issue. We discussed several lifestyle modifications today and he will continue to work on diet, exercise and weight loss efforts. We will continue to monitor. Orders and follow up as documented in patient record. Andre Morrison will use his CPAP consistently.  Counseling  Sleep apnea is a condition in which breathing pauses or becomes shallow during sleep. This happens over and over during the night. This disrupts your sleep and keeps your body from getting the rest that it needs, which can cause tiredness and lack of energy (fatigue) during the day.  Sleep apnea treatment: If you were given a device to open your airway while you sleep, USE IT!  Sleep hygiene:    Limit or avoid alcohol, caffeinated beverages, and cigarettes, especially close to bedtime.   Do not eat a large meal or eat spicy foods right before bedtime. This can lead to digestive discomfort that can make it hard for you to sleep.  Keep a sleep diary to help you and your health care provider figure out what could be causing your insomnia.  . Make your bedroom a dark, comfortable place where it is easy to fall asleep. ? Put up shades or blackout curtains to block light from outside. ? Use a white noise machine to block noise. ? Keep the temperature cool. . Limit screen use before bedtime. This includes: ? Watching TV. ? Using your smartphone, tablet, or computer. . Stick to a routine that includes going to bed and waking up at the same times every day and night. This can help you fall asleep faster. Consider making a quiet activity, such as reading, part of your nighttime routine. . Try to avoid taking naps during the day so that you sleep better at night. . Get out of bed if you are still awake after 15 minutes of trying to sleep. Keep the lights down, but try reading or doing a quiet activity. When you feel sleepy, go  back to bed.  Type 2 diabetes mellitus with obesity ([place]). Good blood sugar control is important to decrease the likelihood of diabetic complications such as nephropathy, neuropathy, limb loss, blindness, coronary artery disease, and death. Intensive lifestyle modification including diet, exercise and weight loss are the first line of treatment for diabetes. Andre Morrison will decrease carbohydrates, increase protein and healthy fats, and will increase exercise. Hemoglobin A1c, Insulin, random labs ordered.  Vitamin D deficiency. Low Vitamin D level contributes to fatigue and are associated with obesity, breast, and colon cancer. VITAMIN D 25 Hydroxy (Vit-D Deficiency, Fractures) level ordered.  Renal stones. Andre Morrison will increase his water intake and will follow  recommendations of his PCP.  Depression screening. Andre Morrison had a positive depression screening. Depression is commonly associated with obesity and often results in emotional eating behaviors. We will monitor this closely and work on CBT to help improve the non-hunger eating patterns. Referral to Psychology may be required if no improvement is seen as he continues in our clinic.  At risk for heart disease. Andre Morrison was given approximately 15 minutes of coronary artery disease prevention counseling today. He is 59 y.o. male and has risk factors for heart disease including obesity. We discussed intensive lifestyle modifications today with an emphasis on specific weight loss instructions and strategies.   Repetitive spaced learning was employed today to elicit superior memory formation and behavioral change.  Class 2 severe obesity with serious comorbidity and body mass index (BMI) of 35.0 to 35.9 in adult, unspecified obesity type ([place]).  Andre Morrison is currently in the action stage of change and his goal is to continue with weight loss efforts. I recommend Andre Morrison begin the structured treatment plan as follows:  He has agreed to the Category 4 Plan with additional breakfast options.  We independently reviewed with the patient labs from 02/21/2018 including A1c, CBC, and creatinine.  Exercise goals: Andre Morrison will walk 4,000 to 6,000 steps per day.  Behavioral modification strategies: increasing lean protein intake, decreasing simple carbohydrates, increasing vegetables, increasing water intake, decreasing eating out, no skipping meals, meal planning and cooking strategies, keeping healthy foods in the home and planning for success.  He was informed of the importance of frequent follow-up visits to maximize his success with intensive lifestyle modifications for his multiple health conditions. He was informed we would discuss his lab results at his next visit unless there is a critical issue that needs to  be addressed sooner. Andre Morrison agreed to keep his next visit at the agreed upon time to discuss these results.  Objective:   Blood pressure (!) 159/82, pulse 69, temperature 98.4 F (36.9 C), temperature source Oral, height 6\' 3"  (1.905 m), weight 259 lb (117.5 kg), SpO2 99 %. Body mass index is 32.37 kg/m.  EKG: Sinus  Bradycardia with a rate of 59 BPM. Within normal limits.   Indirect Calorimeter completed today shows a VO2 of 437 and a REE of 3043.  His calculated basal metabolic rate is [id number] thus his basal metabolic rate is better than expected.  General: Cooperative, alert, well developed, in no acute distress. HEENT: Conjunctivae and lids unremarkable. Cardiovascular: Regular rhythm.  Lungs: Normal work of breathing. Neurologic: No focal deficits.   Lab Results  Component Value Date   CREATININE 1.01 02/21/2018   BUN 19 02/21/2018   NA 138 02/21/2018   K 3.6 02/21/2018   CL 105 02/21/2018   CO2 24 02/21/2018   Lab Results  Component Value Date   ALT 27 02/20/2018   AST  29 02/20/2018   ALKPHOS 61 02/20/2018   BILITOT 1.8 (H) 02/20/2018   Lab Results  Component Value Date   HGBA1C 6.2 (H) 02/21/2018   HGBA1C 6.1 (H) 10/12/2007   HGBA1C 9.5 (H) 10/26/2006   HGBA1C 8.1 (H) 07/21/2006   No results found for: INSULIN Lab Results  Component Value Date   TSH 1.33 06/11/2006   Lab Results  Component Value Date   CHOL 128 06/17/2007   HDL 31.8 (L) 06/17/2007   LDLCALC 78 06/17/2007   TRIG 93 06/17/2007   CHOLHDL 4.0 CALC 06/17/2007   Lab Results  Component Value Date   WBC 8.5 02/21/2018   HGB 12.7 (L) 02/21/2018   HCT 40.1 02/21/2018   MCV 96.6 02/21/2018   PLT 129 (L) 02/21/2018   Lab Results  Component Value Date   IRON 97 10/12/2007   Attestation Statements:   Reviewed by clinician on day of visit: allergies, medications, problem list, medical history, surgical history, family history, social history, and previous encounter notes.  Migdalia Dk,  am acting as Location manager for [employer], DO   I have reviewed the above documentation for accuracy and completeness, and I agree with the above. Jearld Lesch, DO

## 2019-06-30 LAB — COMPREHENSIVE METABOLIC PANEL
ALT: 30 IU/L (ref 0–44)
AST: 22 IU/L (ref 0–40)
Albumin/Globulin Ratio: 1.9 (ref 1.2–2.2)
Albumin: 4.7 g/dL (ref 3.8–4.9)
Alkaline Phosphatase: 74 IU/L (ref 39–117)
BUN/Creatinine Ratio: 14 (ref 9–20)
BUN: 13 mg/dL (ref 6–24)
Bilirubin Total: 0.7 mg/dL (ref 0.0–1.2)
CO2: 23 mmol/L (ref 20–29)
Calcium: 9.4 mg/dL (ref 8.7–10.2)
Chloride: 102 mmol/L (ref 96–106)
Creatinine, Ser: 0.9 mg/dL (ref 0.76–1.27)
GFR calc Af Amer: 108 mL/min/{1.73_m2} (ref >59)
GFR calc non Af Amer: 93 mL/min/{1.73_m2} (ref >59)
Globulin, Total: 2.5 g/dL (ref 1.5–4.5)
Glucose: 147 mg/dL — ABNORMAL HIGH (ref 65–99)
Potassium: 4.6 mmol/L (ref 3.5–5.2)
Sodium: 139 mmol/L (ref 134–144)
Total Protein: 7.2 g/dL (ref 6.0–8.5)

## 2019-06-30 LAB — INSULIN, RANDOM: INSULIN: 30.1 u[IU]/mL — ABNORMAL HIGH (ref 2.6–24.9)

## 2019-06-30 LAB — VITAMIN D 25 HYDROXY (VIT D DEFICIENCY, FRACTURES): Vit D, 25-Hydroxy: 34.9 ng/mL (ref 30.0–100.0)

## 2019-06-30 LAB — HEMOGLOBIN A1C
Est. average glucose Bld gHb Est-mCnc: 137 mg/dL
Hgb A1c MFr Bld: 6.4 % — ABNORMAL HIGH (ref 4.8–5.6)

## 2019-07-13 ENCOUNTER — Encounter (INDEPENDENT_AMBULATORY_CARE_PROVIDER_SITE_OTHER): Payer: Self-pay | Admitting: Bariatrics

## 2019-07-13 ENCOUNTER — Ambulatory Visit (INDEPENDENT_AMBULATORY_CARE_PROVIDER_SITE_OTHER): Payer: BC Managed Care – PPO | Admitting: Bariatrics

## 2019-07-13 ENCOUNTER — Other Ambulatory Visit: Payer: Self-pay

## 2019-07-13 VITALS — BP 151/77 | HR 65 | Temp 98.4°F | Ht 75.0 in | Wt 269.0 lb

## 2019-07-13 DIAGNOSIS — E559 Vitamin D deficiency, unspecified: Secondary | ICD-10-CM

## 2019-07-13 DIAGNOSIS — E669 Obesity, unspecified: Secondary | ICD-10-CM | POA: Diagnosis not present

## 2019-07-13 DIAGNOSIS — E1169 Type 2 diabetes mellitus with other specified complication: Secondary | ICD-10-CM | POA: Diagnosis not present

## 2019-07-13 DIAGNOSIS — I10 Essential (primary) hypertension: Secondary | ICD-10-CM | POA: Diagnosis not present

## 2019-07-13 DIAGNOSIS — Z6833 Body mass index (BMI) 33.0-33.9, adult: Secondary | ICD-10-CM

## 2019-07-13 NOTE — Progress Notes (Signed)
Chief Complaint:   Andre Morrison is here to discuss his progress with his Andre treatment plan along with follow-up of his Andre related diagnoses. Andre Morrison is on the Category 4 Plan with additional breakfast options and states he is following his eating plan approximately 100% of the time. Andre Morrison states he is walking 2+ miles 7 times per week.  Today's visit was #: 2 Starting weight: 279 lbs Starting date: 06/29/2019 Today's weight: 269 lbs Today's date: 07/13/2019 Total lbs lost to date:10 Total lbs lost since last in-office visit: 10  Interim History: Andre Morrison is down 10 lbs and doing well overall. He does not like the milk. He thinks that this is a lot of food. He states his cravings are better.  Subjective:   Type 2 diabetes mellitus with Andre (Andre Morrison). Diabetes is controlled. He is on no medication.  Lab Results  Component Value Date   HGBA1C 6.4 (H) 06/29/2019   HGBA1C 6.2 (H) 02/21/2018   HGBA1C 6.1 (H) 10/12/2007   Lab Results  Component Value Date   LDLCALC 78 06/17/2007   CREATININE 0.90 06/29/2019   Lab Results  Component Value Date   INSULIN 30.1 (H) 06/29/2019   Vitamin D deficiency. Andre Morrison is on no Vitamin D supplementation. Last Vitamin D 34.9 on 06/29/2019.  Essential hypertension. Andre Morrison is taking Cozaar. Blood pressure is reasonably well controlled.  BP Readings from Last 3 Encounters:  07/13/19 (!) 151/77  06/29/19 (!) 159/82  05/17/18 120/76   Lab Results  Component Value Date   CREATININE 0.90 06/29/2019   CREATININE 1.01 02/21/2018   CREATININE 1.05 02/20/2018   Assessment/Plan:   Type 2 diabetes mellitus with Andre ([place]). Good blood sugar control is important to decrease the likelihood of diabetic complications such as nephropathy, neuropathy, limb loss, blindness, coronary artery disease, and death. Intensive lifestyle modification including diet, exercise and weight loss are the first line of treatment for diabetes.  Andre Morrison will decrease his carbohydrate intake.  Vitamin D deficiency. Low Vitamin D level contributes to fatigue and are associated with Andre, breast, and colon cancer. He was given a prescription for Vitamin D @50 ,000 IU every week #4 with 0 refills and will follow-up for routine testing of Vitamin D, at least 2-3 times per year to avoid over-replacement.  Essential hypertension. Andre Morrison is working on healthy weight loss and exercise to improve blood pressure control. We will watch for signs of hypotension as he continues his lifestyle modifications. He will continue his medications as directed.  Class 1 Andre with serious comorbidity and body mass index (BMI) of 33.0 to 33.9 in adult, unspecified Andre type.  Andre Morrison is currently in the action stage of change. As such, his goal is to continue with weight loss efforts. He has agreed to the Category 4 Plan with additional lunch options and dinner substitutions for meal.   He will work on meal planning and was given alternatives for milk.  We independently reviewed with the patient labs from 06/29/2019 including CMP, Vitamin D, A1c, and insulin.  Exercise goals: All adults should avoid inactivity. Some physical activity is better than none, and adults who participate in any amount of physical activity gain some health benefits.  Behavioral modification strategies: increasing lean protein intake, decreasing simple carbohydrates, increasing vegetables, increasing water intake, decreasing liquid calories, decreasing eating out, no skipping meals, meal planning and cooking strategies, keeping healthy foods in the home, ways to avoid boredom eating and planning for success.  Andre Morrison has agreed to  follow-up with our clinic in 2 weeks. He was informed of the importance of frequent follow-up visits to maximize his success with intensive lifestyle modifications for his multiple health conditions.   Objective:   Blood pressure (!) 151/77, pulse 65,  temperature 98.4 F (36.9 C), temperature source Oral, height 6\' 3"  (1.905 m), weight 259 lb (117.5 kg), SpO2 99 %. Body mass index is 32.37 kg/m.  General: Cooperative, alert, well developed, in no acute distress. HEENT: Conjunctivae and lids unremarkable. Cardiovascular: Regular rhythm.  Lungs: Normal work of breathing. Neurologic: No focal deficits.   Lab Results  Component Value Date   CREATININE 0.90 06/29/2019   BUN 13 06/29/2019   NA 139 06/29/2019   K 4.6 06/29/2019   CL 102 06/29/2019   CO2 23 06/29/2019   Lab Results  Component Value Date   ALT 30 06/29/2019   AST 22 06/29/2019   ALKPHOS 74 06/29/2019   BILITOT 0.7 06/29/2019   Lab Results  Component Value Date   HGBA1C 6.4 (H) 06/29/2019   HGBA1C 6.2 (H) 02/21/2018   HGBA1C 6.1 (H) 10/12/2007   HGBA1C 9.5 (H) 10/26/2006   HGBA1C 8.1 (H) 07/21/2006   Lab Results  Component Value Date   INSULIN 30.1 (H) 06/29/2019   Lab Results  Component Value Date   TSH 1.33 06/11/2006   Lab Results  Component Value Date   CHOL 128 06/17/2007   HDL 31.8 (L) 06/17/2007   LDLCALC 78 06/17/2007   TRIG 93 06/17/2007   CHOLHDL 4.0 CALC 06/17/2007   Lab Results  Component Value Date   WBC 8.5 02/21/2018   HGB 12.7 (L) 02/21/2018   HCT 40.1 02/21/2018   MCV 96.6 02/21/2018   PLT 129 (L) 02/21/2018   Lab Results  Component Value Date   IRON 97 10/12/2007   Attestation Statements:   Reviewed by clinician on day of visit: allergies, medications, problem list, medical history, surgical history, family history, social history, and previous encounter notes.  Time spent on visit including pre-visit chart review and post-visit charting and care was 30 minutes.   Migdalia Dk, am acting as Location manager for [employer], DO   I have reviewed the above documentation for accuracy and completeness, and I agree with the above. Jearld Lesch, DO

## 2019-07-14 ENCOUNTER — Encounter (INDEPENDENT_AMBULATORY_CARE_PROVIDER_SITE_OTHER): Payer: Self-pay | Admitting: Bariatrics

## 2019-07-14 NOTE — Telephone Encounter (Signed)
Please review

## 2019-07-18 ENCOUNTER — Other Ambulatory Visit (INDEPENDENT_AMBULATORY_CARE_PROVIDER_SITE_OTHER): Payer: Self-pay | Admitting: Bariatrics

## 2019-07-18 MED ORDER — VITAMIN D (ERGOCALCIFEROL) 1.25 MG (50000 UNIT) PO CAPS: 50000.0000 [IU] | ORAL_CAPSULE | ORAL | 0 refills | Status: DC

## 2019-07-18 NOTE — Telephone Encounter (Signed)
Please review

## 2019-07-28 ENCOUNTER — Other Ambulatory Visit: Payer: Self-pay

## 2019-07-28 ENCOUNTER — Ambulatory Visit (INDEPENDENT_AMBULATORY_CARE_PROVIDER_SITE_OTHER): Payer: BC Managed Care – PPO | Admitting: Physician Assistant

## 2019-07-28 ENCOUNTER — Encounter (INDEPENDENT_AMBULATORY_CARE_PROVIDER_SITE_OTHER): Payer: Self-pay | Admitting: Physician Assistant

## 2019-07-28 VITALS — BP 144/73 | HR 60 | Temp 98.2°F | Ht 75.0 in | Wt 265.0 lb

## 2019-07-28 DIAGNOSIS — E559 Vitamin D deficiency, unspecified: Secondary | ICD-10-CM | POA: Diagnosis not present

## 2019-07-28 DIAGNOSIS — E669 Obesity, unspecified: Secondary | ICD-10-CM | POA: Diagnosis not present

## 2019-07-28 DIAGNOSIS — Z6833 Body mass index (BMI) 33.0-33.9, adult: Secondary | ICD-10-CM

## 2019-07-28 DIAGNOSIS — E66811 Obesity, class 1: Secondary | ICD-10-CM

## 2019-08-01 ENCOUNTER — Encounter (INDEPENDENT_AMBULATORY_CARE_PROVIDER_SITE_OTHER): Payer: Self-pay | Admitting: Physician Assistant

## 2019-08-01 NOTE — Progress Notes (Signed)
Chief Complaint:   OBESITY  Andre Morrison is here to discuss his progress with his obesity treatment plan along with follow-up of his obesity related diagnoses. Andre Morrison is on the Category 4 Plan and states he is following his eating plan approximately 100% of the time. Andre Morrison states he is walking 40 minutes 7 times per week.  Today's visit was #: 3 Starting weight: 279 lbs Starting date: 06/29/2019 Today's weight: 265 lbs Today's date: 07/28/2019 Total lbs lost to date: 14 Total lbs lost since last in-office visit: 4  Interim History: Andre Morrison has been tracking his calories and protein. He states that his hunger is controlled and he is enjoying the plan overall.  Subjective:   Vitamin D deficiency. Andre Morrison is on Vitamin D. No nausea, vomiting, or muscle weakness. Last Vitamin D 34.9 on 06/29/2019.  Assessment/Plan:   Vitamin D deficiency. Low Vitamin D level contributes to fatigue and are associated with obesity, breast, and colon cancer. He agrees to continue to take Vitamin D and will follow-up for routine testing of Vitamin D, at least 2-3 times per year to avoid over-replacement.  Class 1 obesity with serious comorbidity and body mass index (BMI) of 33.0 to 33.9 in adult, unspecified obesity type  Andre Morrison is currently in the action stage of change. As such, his goal is to continue with weight loss efforts. He has agreed to the Category 4 Plan.   Exercise goals: For substantial health benefits, adults should do at least 150 minutes (2 hours and 30 minutes) a week of moderate-intensity, or 75 minutes (1 hour and 15 minutes) a week of vigorous-intensity aerobic physical activity, or an equivalent combination of moderate- and vigorous-intensity aerobic activity. Aerobic activity should be performed in episodes of at least 10 minutes, and preferably, it should be spread throughout the week.  Behavioral modification strategies: meal planning and cooking strategies and keeping  healthy foods in the home.  Andre Morrison has agreed to follow-up with our clinic in 2 weeks. He was informed of the importance of frequent follow-up visits to maximize his success with intensive lifestyle modifications for his multiple health conditions.   Objective:   Blood pressure (!) 144/73, pulse 60, temperature 98.2 F (36.8 C), temperature source Oral, height 6\' 3"  (1.905 m), weight 265 lb (120.2 kg), SpO2 97 %. Body mass index is 33.12 kg/m.  General: Cooperative, alert, well developed, in no acute distress. HEENT: Conjunctivae and lids unremarkable. Cardiovascular: Regular rhythm.  Lungs: Normal work of breathing. Neurologic: No focal deficits.   Lab Results  Component Value Date   CREATININE 0.90 06/29/2019   BUN 13 06/29/2019   NA 139 06/29/2019   K 4.6 06/29/2019   CL 102 06/29/2019   CO2 23 06/29/2019   Lab Results  Component Value Date   ALT 30 06/29/2019   AST 22 06/29/2019   ALKPHOS 74 06/29/2019   BILITOT 0.7 06/29/2019   Lab Results  Component Value Date   HGBA1C 6.4 (H) 06/29/2019   HGBA1C 6.2 (H) 02/21/2018   HGBA1C 6.1 (H) 10/12/2007   HGBA1C 9.5 (H) 10/26/2006   HGBA1C 8.1 (H) 07/21/2006   Lab Results  Component Value Date   INSULIN 30.1 (H) 06/29/2019   Lab Results  Component Value Date   TSH 1.33 06/11/2006   Lab Results  Component Value Date   CHOL 128 06/17/2007   HDL 31.8 (L) 06/17/2007   LDLCALC 78 06/17/2007   TRIG 93 06/17/2007   CHOLHDL 4.0 CALC 06/17/2007  Lab Results  Component Value Date   WBC 8.5 02/21/2018   HGB 12.7 (L) 02/21/2018   HCT 40.1 02/21/2018   MCV 96.6 02/21/2018   PLT 129 (L) 02/21/2018   Lab Results  Component Value Date   IRON 97 10/12/2007   Attestation Statements:   Reviewed by clinician on day of visit: allergies, medications, problem list, medical history, surgical history, family history, social history, and previous encounter notes.  Time spent on visit including pre-visit chart review and  post-visit charting and care was 30 minutes.   [name], am acting as transcriptionist for [name], PA-C   I have reviewed the above documentation for accuracy and completeness, and I agree with the above. [name], PA-C

## 2019-08-01 NOTE — Telephone Encounter (Signed)
Please advise. Thanks.  

## 2019-08-15 ENCOUNTER — Encounter (INDEPENDENT_AMBULATORY_CARE_PROVIDER_SITE_OTHER): Payer: Self-pay | Admitting: Physician Assistant

## 2019-08-15 ENCOUNTER — Other Ambulatory Visit: Payer: Self-pay

## 2019-08-15 ENCOUNTER — Ambulatory Visit (INDEPENDENT_AMBULATORY_CARE_PROVIDER_SITE_OTHER): Payer: BC Managed Care – PPO | Admitting: Physician Assistant

## 2019-08-15 VITALS — BP 149/69 | HR 85 | Temp 98.2°F | Ht 75.0 in | Wt 256.0 lb

## 2019-08-15 DIAGNOSIS — E669 Obesity, unspecified: Secondary | ICD-10-CM | POA: Diagnosis not present

## 2019-08-15 DIAGNOSIS — Z9189 Other specified personal risk factors, not elsewhere classified: Secondary | ICD-10-CM

## 2019-08-15 DIAGNOSIS — I10 Essential (primary) hypertension: Secondary | ICD-10-CM

## 2019-08-15 DIAGNOSIS — E559 Vitamin D deficiency, unspecified: Secondary | ICD-10-CM

## 2019-08-15 DIAGNOSIS — Z6832 Body mass index (BMI) 32.0-32.9, adult: Secondary | ICD-10-CM

## 2019-08-15 MED ORDER — VITAMIN D (ERGOCALCIFEROL) 1.25 MG (50000 UNIT) PO CAPS: 50000.0000 [IU] | ORAL_CAPSULE | ORAL | 0 refills | Status: DC

## 2019-08-16 NOTE — Progress Notes (Signed)
Chief Complaint:   OBESITY Andre Morrison is here to discuss his progress with his obesity treatment plan along with follow-up of his obesity related diagnoses. Andre Morrison is on the Category 4 Plan and states he is following his eating plan approximately 100% of the time. Andre Morrison states he is walking 45 miles a week.   Today's visit was #: 4 Starting weight: 279 lbs Starting date: 06/29/2019 Today's weight: 256 lbs Today's date: 08/15/2019 Total lbs lost to date: 23 Total lbs lost since last in-office visit: 9  Interim History: Andre Morrison is doing well with the plan and reports no issues with it. He states that there are times his hunger has decreased.  Subjective:   Vitamin D deficiency. Andre Morrison is on prescription Vitamin D. No nausea, vomiting, or muscle weakness. Last Vitamin D 34.9 on 06/29/2019.  Essential hypertension. Andre Morrison is on losartan.  No chest pain or headache. He is exercising regularly.  BP Readings from Last 3 Encounters:  08/15/19 (!) 149/69  07/28/19 (!) 144/73  07/13/19 (!) 151/77   Lab Results  Component Value Date   CREATININE 0.90 06/29/2019   CREATININE 1.01 02/21/2018   CREATININE 1.05 02/20/2018   At risk for heart disease. Andre Morrison is at a higher than average risk for cardiovascular disease due to obesity.   Assessment/Plan:   Vitamin D deficiency. Low Vitamin D level contributes to fatigue and are associated with obesity, breast, and colon cancer. He agrees to continue to take prescription Vitamin D @50 ,000 IU every week #4 with 0 refills and will follow-up for routine testing of Vitamin D, at least 2-3 times per year to avoid over-replacement.  Essential hypertension. Andre Morrison is working on healthy weight loss and exercise to improve blood pressure control. We will watch for signs of hypotension as he continues his lifestyle modifications. He will continue his medication as directed.  At risk for heart disease. Andre Morrison was given approximately 15  minutes of coronary artery disease prevention counseling today. He is 59 y.o. male and has risk factors for heart disease including obesity. We discussed intensive lifestyle modifications today with an emphasis on specific weight loss instructions and strategies.   Repetitive spaced learning was employed today to elicit superior memory formation and behavioral change.  Class 1 obesity with serious comorbidity and body mass index (BMI) of 32.0 to 32.9 in adult, unspecified obesity type.  Andre Morrison is currently in the action stage of change. As such, his goal is to continue with weight loss efforts. He has agreed to keeping a food journal and adhering to recommended goals of 1800-1900 calories and 115 grams of protein daily.   Exercise goals: For substantial health benefits, adults should do at least 150 minutes (2 hours and 30 minutes) a week of moderate-intensity, or 75 minutes (1 hour and 15 minutes) a week of vigorous-intensity aerobic physical activity, or an equivalent combination of moderate- and vigorous-intensity aerobic activity. Aerobic activity should be performed in episodes of at least 10 minutes, and preferably, it should be spread throughout the week.  Behavioral modification strategies: meal planning and cooking strategies and keeping healthy foods in the home.  Andre Morrison has agreed to follow-up with our clinic in 2 weeks. He was informed of the importance of frequent follow-up visits to maximize his success with intensive lifestyle modifications for his multiple health conditions.   Objective:   Blood pressure (!) 149/69, pulse 85, temperature 98.2 F (36.8 C), temperature source Oral, height 6\' 3"  (1.905 m), weight 256 lb (116.1  kg), SpO2 97 %. Body mass index is 32 kg/m.  General: Cooperative, alert, well developed, in no acute distress. HEENT: Conjunctivae and lids unremarkable. Cardiovascular: Regular rhythm.  Lungs: Normal work of breathing. Neurologic: No focal deficits.    Lab Results  Component Value Date   CREATININE 0.90 06/29/2019   BUN 13 06/29/2019   NA 139 06/29/2019   K 4.6 06/29/2019   CL 102 06/29/2019   CO2 23 06/29/2019   Lab Results  Component Value Date   ALT 30 06/29/2019   AST 22 06/29/2019   ALKPHOS 74 06/29/2019   BILITOT 0.7 06/29/2019   Lab Results  Component Value Date   HGBA1C 6.4 (H) 06/29/2019   HGBA1C 6.2 (H) 02/21/2018   HGBA1C 6.1 (H) 10/12/2007   HGBA1C 9.5 (H) 10/26/2006   HGBA1C 8.1 (H) 07/21/2006   Lab Results  Component Value Date   INSULIN 30.1 (H) 06/29/2019   Lab Results  Component Value Date   TSH 1.33 06/11/2006   Lab Results  Component Value Date   CHOL 128 06/17/2007   HDL 31.8 (L) 06/17/2007   LDLCALC 78 06/17/2007   TRIG 93 06/17/2007   CHOLHDL 4.0 CALC 06/17/2007   Lab Results  Component Value Date   WBC 8.5 02/21/2018   HGB 12.7 (L) 02/21/2018   HCT 40.1 02/21/2018   MCV 96.6 02/21/2018   PLT 129 (L) 02/21/2018   Lab Results  Component Value Date   IRON 97 10/12/2007   Attestation Statements:   Reviewed by clinician on day of visit: allergies, medications, problem list, medical history, surgical history, family history, social history, and previous encounter notes.  [name], am acting as transcriptionist for [name], PA-C   I have reviewed the above documentation for accuracy and completeness, and I agree with the above. [name], PA-C

## 2019-08-29 ENCOUNTER — Ambulatory Visit (INDEPENDENT_AMBULATORY_CARE_PROVIDER_SITE_OTHER): Payer: BC Managed Care – PPO | Admitting: Physician Assistant

## 2019-08-29 ENCOUNTER — Other Ambulatory Visit: Payer: Self-pay

## 2019-08-29 ENCOUNTER — Encounter (INDEPENDENT_AMBULATORY_CARE_PROVIDER_SITE_OTHER): Payer: Self-pay | Admitting: Physician Assistant

## 2019-08-29 VITALS — BP 136/80 | HR 55 | Temp 98.0°F | Ht 75.0 in | Wt 253.0 lb

## 2019-08-29 DIAGNOSIS — E669 Obesity, unspecified: Secondary | ICD-10-CM

## 2019-08-29 DIAGNOSIS — Z6831 Body mass index (BMI) 31.0-31.9, adult: Secondary | ICD-10-CM

## 2019-08-29 DIAGNOSIS — I10 Essential (primary) hypertension: Secondary | ICD-10-CM

## 2019-08-29 NOTE — Progress Notes (Signed)
Chief Complaint:   OBESITY Andre Morrison is here to discuss his progress with his obesity treatment plan along with follow-up of his obesity related diagnoses. [name] is on the Category 4 Plan and states he is following his eating plan approximately 100% of the time. [name] states he is walking 7 miles 7 times per week.  Today's visit was #: 5 Starting weight: 279 lbs Starting date: 06/29/2019 Today's weight: 253 lbs Today's date: 08/29/2019 Total lbs lost to date: 26 Total lbs lost since last in-office visit: 3  Interim History: [name] reports that he has eaten out twice in the past 2 weeks and noticed a 2 lb weight gain the day after. 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  Subjective:   Essential hypertension. Eldor is on losartan. No chest pain. Blood pressure is controlled today.  BP Readings from Last 3 Encounters:  08/29/19 136/80  08/15/19 (!) 149/69  07/28/19 (!) 144/73   Lab Results  Component Value Date   CREATININE 0.90 06/29/2019   CREATININE 1.01 02/21/2018   CREATININE 1.05 02/20/2018   Assessment/Plan:   Essential hypertension. Marvin is working on healthy weight loss and exercise to improve blood pressure control. We will watch for signs of hypotension as he continues his lifestyle modifications. He will continue his medication as directed.  Class 1 obesity with serious comorbidity and body mass index (BMI) of 31.0 to 31.9 in adult, unspecified obesity type.  [name] is currently in the action stage of change. As such, his goal is to continue with weight loss efforts. He has agreed to the Category 4 Plan and will journal 1800  Calories and 115 grams of protein daily.   Exercise goals: For substantial health benefits, adults should do at least 150 minutes (2 hours and 30 minutes) a week of moderate-intensity, or 75 minutes (1 hour and 15 minutes) a week of vigorous-intensity aerobic physical activity, or an equivalent  combination of moderate- and vigorous-intensity aerobic activity. Aerobic activity should be performed in episodes of at least 10 minutes, and preferably, it should be spread throughout the week.  Behavioral modification strategies: meal planning and cooking strategies and keeping healthy foods in the home.  [name] has agreed to follow-up with our clinic in 2 weeks. He was informed of the importance of frequent follow-up visits to maximize his success with intensive lifestyle modifications for his multiple health conditions.   Objective:   Blood pressure 136/80, pulse (!) 55, temperature 98 F (36.7 C), temperature source Oral, height 6\' 3"  (1.905 m), weight 253 lb (114.8 kg), SpO2 99 %. Body mass index is 31.62 kg/m.  General: Cooperative, alert, well developed, in no acute distress. HEENT: Conjunctivae and lids unremarkable. Cardiovascular: Regular rhythm.  Lungs: Normal work of breathing. Neurologic: No focal deficits.   Lab Results  Component Value Date   CREATININE 0.90 06/29/2019   BUN 13 06/29/2019   NA 139 06/29/2019   K 4.6 06/29/2019   CL 102 06/29/2019   CO2 23 06/29/2019   Lab Results  Component Value Date   ALT 30 06/29/2019   AST 22 06/29/2019   ALKPHOS 74 06/29/2019   BILITOT 0.7 06/29/2019   Lab Results  Component Value Date   HGBA1C 6.4 (H) 06/29/2019   HGBA1C 6.2 (H) 02/21/2018   HGBA1C 6.1 (H) 10/12/2007   HGBA1C 9.5 (H) 10/26/2006   HGBA1C 8.1 (H) 07/21/2006   Lab Results  Component Value Date   INSULIN 30.1 (H)  06/29/2019   Lab Results  Component Value Date   TSH 1.33 06/11/2006   Lab Results  Component Value Date   CHOL 128 06/17/2007   HDL 31.8 (L) 06/17/2007   LDLCALC 78 06/17/2007   TRIG 93 06/17/2007   CHOLHDL 4.0 CALC 06/17/2007   Lab Results  Component Value Date   WBC 8.5 02/21/2018   HGB 12.7 (L) 02/21/2018   HCT 40.1 02/21/2018   MCV 96.6 02/21/2018   PLT 129 (L) 02/21/2018   Lab Results  Component Value Date   IRON  97 10/12/2007   Attestation Statements:   Reviewed by clinician on day of visit: allergies, medications, problem list, medical history, surgical history, family history, social history, and previous encounter notes.  Time spent on visit including pre-visit chart review and post-visit charting and care was 30 minutes.   [name], am acting as transcriptionist for [name], PA-C   I have reviewed the above documentation for accuracy and completeness, and I agree with the above. [name], PA-C

## 2019-09-13 ENCOUNTER — Ambulatory Visit (INDEPENDENT_AMBULATORY_CARE_PROVIDER_SITE_OTHER): Payer: BC Managed Care – PPO | Admitting: Physician Assistant

## 2019-09-13 ENCOUNTER — Other Ambulatory Visit: Payer: Self-pay

## 2019-09-13 ENCOUNTER — Encounter (INDEPENDENT_AMBULATORY_CARE_PROVIDER_SITE_OTHER): Payer: Self-pay | Admitting: Physician Assistant

## 2019-09-13 VITALS — BP 148/72 | HR 53 | Temp 98.3°F | Ht 75.0 in | Wt 250.0 lb

## 2019-09-13 DIAGNOSIS — E559 Vitamin D deficiency, unspecified: Secondary | ICD-10-CM | POA: Diagnosis not present

## 2019-09-13 DIAGNOSIS — Z9189 Other specified personal risk factors, not elsewhere classified: Secondary | ICD-10-CM

## 2019-09-13 DIAGNOSIS — Z6831 Body mass index (BMI) 31.0-31.9, adult: Secondary | ICD-10-CM

## 2019-09-13 DIAGNOSIS — E669 Obesity, unspecified: Secondary | ICD-10-CM

## 2019-09-13 DIAGNOSIS — E119 Type 2 diabetes mellitus without complications: Secondary | ICD-10-CM

## 2019-09-13 MED ORDER — VITAMIN D (ERGOCALCIFEROL) 1.25 MG (50000 UNIT) PO CAPS: 50000.0000 [IU] | ORAL_CAPSULE | ORAL | 0 refills | Status: DC

## 2019-09-14 NOTE — Progress Notes (Signed)
Chief Complaint:   OBESITY Andre Morrison is here to discuss his progress with his obesity treatment plan along with follow-up of his obesity related diagnoses. [name] is on the Category 4 Plan and states he is following his eating plan approximately 100% of the time. [name] states he is walking 4-6 miles 7 times per week.  Today's visit was #: 6 Starting weight: 279 lbs Starting date: 06/29/2019 Today's weight: 250 lbs Today's date: 09/13/2019 Total lbs lost to date: 29 Total lbs lost since last in-office visit: 3  Interim History: Andre Morrison states that he followed the plan well with the exception of a couple of occasions where he ate out or had friends over. He feels a good balance of being able to "live life" and stay on plan most of the time.  Subjective:   Vitamin D deficiency. No nausea, vomiting, or muscle weakness on prescription Vitamin D. Last Vitamin D 34.9 on 06/29/2019.  Type 2 diabetes mellitus without complication, without long-term current use of insulin ([place]). Chadney is on no medication. No polyphagia. He is exercising regularly.  Lab Results  Component Value Date   HGBA1C 6.4 (H) 06/29/2019   HGBA1C 6.2 (H) 02/21/2018   HGBA1C 6.1 (H) 10/12/2007   Lab Results  Component Value Date   LDLCALC 78 06/17/2007   CREATININE 0.90 06/29/2019   Lab Results  Component Value Date   INSULIN 30.1 (H) 06/29/2019   At risk for hypoglycemia. [name] is at increased risk for hypoglycemia due to changes in diet, diagnosis of diabetes, and/or insulin use.  Assessment/Plan:   Vitamin D deficiency. Low Vitamin D level contributes to fatigue and are associated with obesity, breast, and colon cancer. He was given a refill on his Vitamin D, Ergocalciferol, (DRISDOL) 1.25 MG (50000 UNIT) CAPS capsule #4 with 0 refills and will follow-up for routine testing of Vitamin D, at least 2-3 times per year to avoid over-replacement.   Type 2 diabetes mellitus without complication,  without long-term current use of insulin ([place]). Good blood sugar control is important to decrease the likelihood of diabetic complications such as nephropathy, neuropathy, limb loss, blindness, coronary artery disease, and death. Intensive lifestyle modification including diet, exercise and weight loss are the first line of treatment for diabetes.   At risk for hypoglycemia. Andre Morrison was given approximately 15 minutes of counseling today regarding prevention of hypoglycemia. He was advised of symptoms of hypoglycemia. Andre Morrison was instructed to avoid skipping meals, eat regular protein rich meals and schedule low calorie snacks as needed.   Repetitive spaced learning was employed today to elicit superior memory formation and behavioral change.  Class 1 obesity with serious comorbidity and body mass index (BMI) of 31.0 to 31.9 in adult, unspecified obesity type.  Andre Morrison is currently in the action stage of change. As such, his goal is to continue with weight loss efforts. He has agreed to the Category 4 Plan.   Exercise goals: For substantial health benefits, adults should do at least 150 minutes (2 hours and 30 minutes) a week of moderate-intensity, or 75 minutes (1 hour and 15 minutes) a week of vigorous-intensity aerobic physical activity, or an equivalent combination of moderate- and vigorous-intensity aerobic activity. Aerobic activity should be performed in episodes of at least 10 minutes, and preferably, it should be spread throughout the week.  Behavioral modification strategies: meal planning and cooking strategies and planning for success.  Andre Morrison has agreed to follow-up with our clinic in 2 weeks. He was informed of  the importance of frequent follow-up visits to maximize his success with intensive lifestyle modifications for his multiple health conditions.   Objective:   Blood pressure (!) 148/72, pulse (!) 53, temperature 98.3 F (36.8 C), temperature source Oral, height 6\' 3"  (1.905 m),  weight 250 lb (113.4 kg), SpO2 98 %. Body mass index is 31.25 kg/m.  General: Cooperative, alert, well developed, in no acute distress. HEENT: Conjunctivae and lids unremarkable. Cardiovascular: Regular rhythm.  Lungs: Normal work of breathing. Neurologic: No focal deficits.   Lab Results  Component Value Date   CREATININE 0.90 06/29/2019   BUN 13 06/29/2019   NA 139 06/29/2019   K 4.6 06/29/2019   CL 102 06/29/2019   CO2 23 06/29/2019   Lab Results  Component Value Date   ALT 30 06/29/2019   AST 22 06/29/2019   ALKPHOS 74 06/29/2019   BILITOT 0.7 06/29/2019   Lab Results  Component Value Date   HGBA1C 6.4 (H) 06/29/2019   HGBA1C 6.2 (H) 02/21/2018   HGBA1C 6.1 (H) 10/12/2007   HGBA1C 9.5 (H) 10/26/2006   HGBA1C 8.1 (H) 07/21/2006   Lab Results  Component Value Date   INSULIN 30.1 (H) 06/29/2019   Lab Results  Component Value Date   TSH 1.33 06/11/2006   Lab Results  Component Value Date   CHOL 128 06/17/2007   HDL 31.8 (L) 06/17/2007   LDLCALC 78 06/17/2007   TRIG 93 06/17/2007   CHOLHDL 4.0 CALC 06/17/2007   Lab Results  Component Value Date   WBC 8.5 02/21/2018   HGB 12.7 (L) 02/21/2018   HCT 40.1 02/21/2018   MCV 96.6 02/21/2018   PLT 129 (L) 02/21/2018   Lab Results  Component Value Date   IRON 97 10/12/2007   Attestation Statements:   Reviewed by clinician on day of visit: allergies, medications, problem list, medical history, surgical history, family history, social history, and previous encounter notes.  [name], am acting as transcriptionist for [name], PA-C   I have reviewed the above documentation for accuracy and completeness, and I agree with the above. [name], PA-C

## 2019-09-29 ENCOUNTER — Other Ambulatory Visit: Payer: Self-pay

## 2019-09-29 ENCOUNTER — Ambulatory Visit (INDEPENDENT_AMBULATORY_CARE_PROVIDER_SITE_OTHER): Payer: BC Managed Care – PPO | Admitting: Physician Assistant

## 2019-09-29 ENCOUNTER — Encounter (INDEPENDENT_AMBULATORY_CARE_PROVIDER_SITE_OTHER): Payer: Self-pay | Admitting: Physician Assistant

## 2019-09-29 VITALS — BP 145/69 | HR 57 | Temp 98.2°F | Ht 75.0 in | Wt 246.0 lb

## 2019-09-29 DIAGNOSIS — Z6830 Body mass index (BMI) 30.0-30.9, adult: Secondary | ICD-10-CM

## 2019-09-29 DIAGNOSIS — E669 Obesity, unspecified: Secondary | ICD-10-CM | POA: Diagnosis not present

## 2019-09-29 DIAGNOSIS — E559 Vitamin D deficiency, unspecified: Secondary | ICD-10-CM | POA: Diagnosis not present

## 2019-10-03 NOTE — Progress Notes (Signed)
Chief Complaint:   OBESITY Andre Morrison is here to discuss his progress with his obesity treatment plan along with follow-up of his obesity related diagnoses. Andre Morrison is on the Category 4 Plan and states he is following his eating plan approximately 100% of the time. Andre Morrison states he is walking 5-10 miles 7 times per week.  Today's visit was #: 7 Starting weight: 279 lbs Starting date: 06/29/2019 Today's weight: 246 lbs Today's date: 09/29/2019 Total lbs lost to date: 33 Total lbs lost since last in-office visit: 4  Interim History: Andre Morrison continues to enjoy the plan and is journaling daily. He states that he will be eating out more often next week and then will be meeting his sisters in [place] for a few days.  Subjective:   Vitamin D deficiency. Last Vitamin D level was not at goal - 34.9 on 06/29/2019. Andre Morrison is on weekly Vitamin D. No nausea, vomiting, or muscle weakness.   Assessment/Plan:   Vitamin D deficiency. Low Vitamin D level contributes to fatigue and are associated with obesity, breast, and colon cancer. He will discontinue weekly Vitamin D and start Vitamin D 2,000 units daily (OTC). He will follow-up for routine testing of Vitamin D, at least 2-3 times per year to avoid over-replacement.  Class 1 obesity with serious comorbidity and body mass index (BMI) of 30.0 to 30.9 in adult, unspecified obesity type.  Andre Morrison is currently in the action stage of change. As such, his goal is to continue with weight loss efforts. He has agreed to the Category 4 Plan.   Exercise goals: For substantial health benefits, adults should do at least 150 minutes (2 hours and 30 minutes) a week of moderate-intensity, or 75 minutes (1 hour and 15 minutes) a week of vigorous-intensity aerobic physical activity, or an equivalent combination of moderate- and vigorous-intensity aerobic activity. Aerobic activity should be performed in episodes of at least 10 minutes, and preferably,  it should be spread throughout the week.  Behavioral modification strategies: meal planning and cooking strategies and keeping healthy foods in the home.  Andre Morrison has agreed to follow-up with our clinic in 3 weeks. He was informed of the importance of frequent follow-up visits to maximize his success with intensive lifestyle modifications for his multiple health conditions.   Objective:   Blood pressure (!) 145/69, pulse (!) 57, temperature 98.2 F (36.8 C), temperature source Oral, height 6\' 3"  (1.905 m), weight 246 lb (111.6 kg), SpO2 98 %. Body mass index is 30.75 kg/m.  General: Cooperative, alert, well developed, in no acute distress. HEENT: Conjunctivae and lids unremarkable. Cardiovascular: Regular rhythm.  Lungs: Normal work of breathing. Neurologic: No focal deficits.   Lab Results  Component Value Date   CREATININE 0.90 06/29/2019   BUN 13 06/29/2019   NA 139 06/29/2019   K 4.6 06/29/2019   CL 102 06/29/2019   CO2 23 06/29/2019   Lab Results  Component Value Date   ALT 30 06/29/2019   AST 22 06/29/2019   ALKPHOS 74 06/29/2019   BILITOT 0.7 06/29/2019   Lab Results  Component Value Date   HGBA1C 6.4 (H) 06/29/2019   HGBA1C 6.2 (H) 02/21/2018   HGBA1C 6.1 (H) 10/12/2007   HGBA1C 9.5 (H) 10/26/2006   HGBA1C 8.1 (H) 07/21/2006   Lab Results  Component Value Date   INSULIN 30.1 (H) 06/29/2019   Lab Results  Component Value Date   TSH 1.33 06/11/2006   Lab Results  Component Value Date  CHOL 128 06/17/2007   HDL 31.8 (L) 06/17/2007   LDLCALC 78 06/17/2007   TRIG 93 06/17/2007   CHOLHDL 4.0 CALC 06/17/2007   Lab Results  Component Value Date   WBC 8.5 02/21/2018   HGB 12.7 (L) 02/21/2018   HCT 40.1 02/21/2018   MCV 96.6 02/21/2018   PLT 129 (L) 02/21/2018   Lab Results  Component Value Date   IRON 97 10/12/2007   Attestation Statements:   Reviewed by clinician on day of visit: allergies, medications, problem list, medical history, surgical  history, family history, social history, and previous encounter notes.  Time spent on visit including pre-visit chart review and post-visit charting and care was 32 minutes.   [name], am acting as transcriptionist for [name], PA-C   I have reviewed the above documentation for accuracy and completeness, and I agree with the above. [name], PA-C

## 2019-10-20 ENCOUNTER — Ambulatory Visit (INDEPENDENT_AMBULATORY_CARE_PROVIDER_SITE_OTHER): Payer: BC Managed Care – PPO | Admitting: Physician Assistant

## 2019-11-10 ENCOUNTER — Encounter (INDEPENDENT_AMBULATORY_CARE_PROVIDER_SITE_OTHER): Payer: Self-pay | Admitting: Physician Assistant

## 2019-11-10 ENCOUNTER — Ambulatory Visit (INDEPENDENT_AMBULATORY_CARE_PROVIDER_SITE_OTHER): Payer: BC Managed Care – PPO | Admitting: Physician Assistant

## 2019-11-10 ENCOUNTER — Other Ambulatory Visit: Payer: Self-pay

## 2019-11-10 VITALS — BP 144/83 | HR 52 | Temp 98.0°F | Ht 75.0 in | Wt 241.0 lb

## 2019-11-10 DIAGNOSIS — E669 Obesity, unspecified: Secondary | ICD-10-CM | POA: Diagnosis not present

## 2019-11-10 DIAGNOSIS — I10 Essential (primary) hypertension: Secondary | ICD-10-CM

## 2019-11-10 DIAGNOSIS — I152 Hypertension secondary to endocrine disorders: Secondary | ICD-10-CM

## 2019-11-10 DIAGNOSIS — E1159 Type 2 diabetes mellitus with other circulatory complications: Secondary | ICD-10-CM | POA: Diagnosis not present

## 2019-11-10 DIAGNOSIS — Z6830 Body mass index (BMI) 30.0-30.9, adult: Secondary | ICD-10-CM

## 2019-11-10 NOTE — Progress Notes (Signed)
Chief Complaint:   OBESITY Andre Morrison is here to discuss his progress with his obesity treatment plan along with follow-up of his obesity related diagnoses. Andre Morrison is on the Category 4 Plan and states he is following his eating plan approximately 100% of the time. Andre Morrison states he is walking 5-7 miles per day 7 times per week.  Today's visit was #: 8 Starting weight: 279 lbs Starting date: 06/29/2019 Today's weight: 241 lbs Today's date: 11/10/2019 Total lbs lost to date: 38 Total lbs lost since last in-office visit: 5  Interim History: Andre Morrison reports that he gained 3 lbs while in [place] with his sisters, and has since lost that plus more. He is frustrated with the slowing of his weight loss.  Subjective:   Hypertension associated with diabetes ([place]). Lexander is on losartan. Blood pressure is controlled.  BP Readings from Last 3 Encounters:  11/10/19 (!) 144/83  09/29/19 (!) 145/69  09/13/19 (!) 148/72   Lab Results  Component Value Date   CREATININE 0.90 06/29/2019   CREATININE 1.01 02/21/2018   CREATININE 1.05 02/20/2018   Assessment/Plan:   Hypertension associated with diabetes ([place]). Andre Morrison is working on healthy weight loss and exercise to improve blood pressure control. We will watch for signs of hypotension as he continues his lifestyle modifications.  Class 1 obesity with serious comorbidity and body mass index (BMI) of 30.0 to 30.9 in adult, unspecified obesity type.  Andre Morrison is currently in the action stage of change. As such, his goal is to continue with weight loss efforts. He has agreed to the Category 4 Plan.   Exercise goals: For substantial health benefits, adults should do at least 150 minutes (2 hours and 30 minutes) a week of moderate-intensity, or 75 minutes (1 hour and 15 minutes) a week of vigorous-intensity aerobic physical activity, or an equivalent combination of moderate- and vigorous-intensity aerobic activity. Aerobic activity  should be performed in episodes of at least 10 minutes, and preferably, it should be spread throughout the week.  Behavioral modification strategies: increasing lean protein intake and better snacking choices.  Andre Morrison has agreed to follow-up with our clinic in 3 weeks. He was informed of the importance of frequent follow-up visits to maximize his success with intensive lifestyle modifications for his multiple health conditions.   Objective:   Blood pressure (!) 144/83, pulse 52, temperature 98 F (36.7 C), temperature source Oral, height 6\' 3"  (1.905 m), weight (!) 241 lb (109.3 kg), SpO2 100 %. Body mass index is 30.12 kg/m.  General: Cooperative, alert, well developed, in no acute distress. HEENT: Conjunctivae and lids unremarkable. Cardiovascular: Regular rhythm.  Lungs: Normal work of breathing. Neurologic: No focal deficits.   Lab Results  Component Value Date   CREATININE 0.90 06/29/2019   BUN 13 06/29/2019   NA 139 06/29/2019   K 4.6 06/29/2019   CL 102 06/29/2019   CO2 23 06/29/2019   Lab Results  Component Value Date   ALT 30 06/29/2019   AST 22 06/29/2019   ALKPHOS 74 06/29/2019   BILITOT 0.7 06/29/2019   Lab Results  Component Value Date   HGBA1C 6.4 (H) 06/29/2019   HGBA1C 6.2 (H) 02/21/2018   HGBA1C 6.1 (H) 10/12/2007   HGBA1C 9.5 (H) 10/26/2006   HGBA1C 8.1 (H) 07/21/2006   Lab Results  Component Value Date   INSULIN 30.1 (H) 06/29/2019   Lab Results  Component Value Date   TSH 1.33 06/11/2006   Lab Results  Component Value Date  CHOL 128 06/17/2007   HDL 31.8 (L) 06/17/2007   LDLCALC 78 06/17/2007   TRIG 93 06/17/2007   CHOLHDL 4.0 CALC 06/17/2007   Lab Results  Component Value Date   WBC 8.5 02/21/2018   HGB 12.7 (L) 02/21/2018   HCT 40.1 02/21/2018   MCV 96.6 02/21/2018   PLT 129 (L) 02/21/2018   Lab Results  Component Value Date   IRON 97 10/12/2007   Attestation Statements:   Reviewed by clinician on day of visit:  allergies, medications, problem list, medical history, surgical history, family history, social history, and previous encounter notes.  Time spent on visit including pre-visit chart review and post-visit charting and care was 35 minutes.   [name], am acting as transcriptionist for [name], PA-C   I have reviewed the above documentation for accuracy and completeness, and I agree with the above. [name], PA-C

## 2019-12-01 ENCOUNTER — Ambulatory Visit (INDEPENDENT_AMBULATORY_CARE_PROVIDER_SITE_OTHER): Payer: BC Managed Care – PPO | Admitting: Physician Assistant

## 2019-12-12 ENCOUNTER — Other Ambulatory Visit: Payer: Self-pay

## 2019-12-12 ENCOUNTER — Encounter (INDEPENDENT_AMBULATORY_CARE_PROVIDER_SITE_OTHER): Payer: Self-pay | Admitting: Physician Assistant

## 2019-12-12 ENCOUNTER — Ambulatory Visit (INDEPENDENT_AMBULATORY_CARE_PROVIDER_SITE_OTHER): Payer: BC Managed Care – PPO | Admitting: Physician Assistant

## 2019-12-12 VITALS — BP 133/82 | HR 48 | Temp 97.9°F | Ht 75.0 in | Wt 241.0 lb

## 2019-12-12 DIAGNOSIS — E669 Obesity, unspecified: Secondary | ICD-10-CM | POA: Diagnosis not present

## 2019-12-12 DIAGNOSIS — E119 Type 2 diabetes mellitus without complications: Secondary | ICD-10-CM | POA: Diagnosis not present

## 2019-12-12 DIAGNOSIS — E559 Vitamin D deficiency, unspecified: Secondary | ICD-10-CM | POA: Diagnosis not present

## 2019-12-12 DIAGNOSIS — Z6830 Body mass index (BMI) 30.0-30.9, adult: Secondary | ICD-10-CM

## 2019-12-13 NOTE — Progress Notes (Signed)
Chief Complaint:   OBESITY Andre Morrison is here to discuss his progress with his obesity treatment plan along with follow-up of his obesity related diagnoses. Andre Morrison is on the Category 4 Plan and states he is following his eating plan approximately 100% of the time. Andre Morrison states he is walking 2-4 miles 7 times per week.  Today's visit was #: 9 Starting weight: 279 lbs Starting date: 06/29/2019 Today's weight: 241 lbs Today's date: 12/12/2019 Total lbs lost to date: 38 lbs Total lbs lost since last in-office visit: 0  Interim History: Andre Morrison notes that he just returned from the beach, where he gained 4 pounds and since, he lost it.  He is frustrated that he is not down in the 230s.  He is traveling to [place] next week.  Subjective:   1. Vitamin D deficiency Andre Morrison's Vitamin D level was 34.9 on 06/29/2019. He is currently taking prescription vitamin D 50,000 IU each week. He denies nausea, vomiting or muscle weakness.  2. Type 2 diabetes mellitus without complication, without long-term current use of insulin (HCC) Medications reviewed. Diabetic ROS: no polyuria or polydipsia, no chest pain, dyspnea or TIA's, no numbness, tingling or pain in extremities.  He is on no medications.  Lab Results  Component Value Date   HGBA1C 6.4 (H) 06/29/2019   HGBA1C 6.2 (H) 02/21/2018   HGBA1C 6.1 (H) 10/12/2007   Lab Results  Component Value Date   LDLCALC 78 06/17/2007   CREATININE 0.90 06/29/2019   Lab Results  Component Value Date   INSULIN 30.1 (H) 06/29/2019   Assessment/Plan:   1. Vitamin D deficiency Low Vitamin D level contributes to fatigue and are associated with obesity, breast, and colon cancer. He agrees to continue to take prescription Vitamin D @50 ,000 IU every week and will follow-up for routine testing of Vitamin D, at least 2-3 times per year to avoid over-replacement.  2. Type 2 diabetes mellitus without complication, without long-term current use of insulin  (HCC) Good blood sugar control is important to decrease the likelihood of diabetic complications such as nephropathy, neuropathy, limb loss, blindness, coronary artery disease, and death. Intensive lifestyle modification including diet, exercise and weight loss are the first line of treatment for diabetes.  Ozempic was discussed in detail today.  3. Class 1 obesity with serious comorbidity and body mass index (BMI) of 30.0 to 30.9 in adult, unspecified obesity type Andre Morrison is currently in the action stage of change. As such, his goal is to continue with weight loss efforts. He has agreed to keeping a food journal and adhering to recommended goals of 1800-2000 calories and 125 grams of protein.   We discussed Ozempic in detail today.  Exercise goals: As is.  Behavioral modification strategies: meal planning and cooking strategies, keeping healthy foods in the home, planning for success and keeping a strict food journal.  Andre Morrison has agreed to follow-up with our clinic in 2-3 weeks. He was informed of the importance of frequent follow-up visits to maximize his success with intensive lifestyle modifications for his multiple health conditions.   Objective:   Blood pressure 133/82, pulse (!) 48, temperature 97.9 F (36.6 C), temperature source Oral, height 6\' 3"  (1.905 m), weight 241 lb (109.3 kg), SpO2 96 %. Body mass index is 30.12 kg/m.  General: Cooperative, alert, well developed, in no acute distress. HEENT: Conjunctivae and lids unremarkable. Cardiovascular: Regular rhythm.  Lungs: Normal work of breathing. Neurologic: No focal deficits.   Lab Results  Component Value Date  CREATININE 0.90 06/29/2019   BUN 13 06/29/2019   NA 139 06/29/2019   K 4.6 06/29/2019   CL 102 06/29/2019   CO2 23 06/29/2019   Lab Results  Component Value Date   ALT 30 06/29/2019   AST 22 06/29/2019   ALKPHOS 74 06/29/2019   BILITOT 0.7 06/29/2019   Lab Results  Component Value Date   HGBA1C 6.4  (H) 06/29/2019   HGBA1C 6.2 (H) 02/21/2018   HGBA1C 6.1 (H) 10/12/2007   HGBA1C 9.5 (H) 10/26/2006   HGBA1C 8.1 (H) 07/21/2006   Lab Results  Component Value Date   INSULIN 30.1 (H) 06/29/2019   Lab Results  Component Value Date   TSH 1.33 06/11/2006   Lab Results  Component Value Date   CHOL 128 06/17/2007   HDL 31.8 (L) 06/17/2007   LDLCALC 78 06/17/2007   TRIG 93 06/17/2007   CHOLHDL 4.0 CALC 06/17/2007   Lab Results  Component Value Date   WBC 8.5 02/21/2018   HGB 12.7 (L) 02/21/2018   HCT 40.1 02/21/2018   MCV 96.6 02/21/2018   PLT 129 (L) 02/21/2018   Lab Results  Component Value Date   IRON 97 10/12/2007   Attestation Statements:   Reviewed by clinician on day of visit: allergies, medications, problem list, medical history, surgical history, family history, social history, and previous encounter notes.  Time spent on visit including pre-visit chart review and post-visit care and charting was 46 minutes.   I, Water quality scientist, CMA, am acting as transcriptionist for [name], PA-C  I have reviewed the above documentation for accuracy and completeness, and I agree with the above. [name], PA-C

## 2019-12-15 ENCOUNTER — Encounter (INDEPENDENT_AMBULATORY_CARE_PROVIDER_SITE_OTHER): Payer: Self-pay | Admitting: Physician Assistant

## 2019-12-15 NOTE — Telephone Encounter (Signed)
Please review. Thanks!  

## 2020-01-04 ENCOUNTER — Ambulatory Visit (INDEPENDENT_AMBULATORY_CARE_PROVIDER_SITE_OTHER): Payer: BC Managed Care – PPO | Admitting: Physician Assistant

## 2020-01-04 ENCOUNTER — Other Ambulatory Visit: Payer: Self-pay

## 2020-01-04 ENCOUNTER — Encounter (INDEPENDENT_AMBULATORY_CARE_PROVIDER_SITE_OTHER): Payer: Self-pay | Admitting: Physician Assistant

## 2020-01-04 VITALS — BP 129/61 | HR 70 | Temp 97.9°F | Ht 75.0 in | Wt 239.0 lb

## 2020-01-04 DIAGNOSIS — I10 Essential (primary) hypertension: Secondary | ICD-10-CM

## 2020-01-04 DIAGNOSIS — Z9189 Other specified personal risk factors, not elsewhere classified: Secondary | ICD-10-CM | POA: Diagnosis not present

## 2020-01-04 DIAGNOSIS — E669 Obesity, unspecified: Secondary | ICD-10-CM

## 2020-01-04 DIAGNOSIS — I152 Hypertension secondary to endocrine disorders: Secondary | ICD-10-CM

## 2020-01-04 DIAGNOSIS — E559 Vitamin D deficiency, unspecified: Secondary | ICD-10-CM

## 2020-01-04 DIAGNOSIS — E1159 Type 2 diabetes mellitus with other circulatory complications: Secondary | ICD-10-CM | POA: Diagnosis not present

## 2020-01-04 DIAGNOSIS — Z6830 Body mass index (BMI) 30.0-30.9, adult: Secondary | ICD-10-CM

## 2020-01-05 LAB — COMPREHENSIVE METABOLIC PANEL
ALT: 17 IU/L (ref 0–44)
AST: 15 IU/L (ref 0–40)
Albumin/Globulin Ratio: 2 (ref 1.2–2.2)
Albumin: 5 g/dL — ABNORMAL HIGH (ref 3.8–4.9)
Alkaline Phosphatase: 70 IU/L (ref 44–121)
BUN/Creatinine Ratio: 23 (ref 10–24)
BUN: 19 mg/dL (ref 8–27)
Bilirubin Total: 1 mg/dL (ref 0.0–1.2)
CO2: 27 mmol/L (ref 20–29)
Calcium: 9.5 mg/dL (ref 8.6–10.2)
Chloride: 101 mmol/L (ref 96–106)
Creatinine, Ser: 0.82 mg/dL (ref 0.76–1.27)
GFR calc Af Amer: 111 mL/min/{1.73_m2} (ref >59)
GFR calc non Af Amer: 96 mL/min/{1.73_m2} (ref >59)
Globulin, Total: 2.5 g/dL (ref 1.5–4.5)
Glucose: 101 mg/dL — ABNORMAL HIGH (ref 65–99)
Potassium: 4.4 mmol/L (ref 3.5–5.2)
Sodium: 140 mmol/L (ref 134–144)
Total Protein: 7.5 g/dL (ref 6.0–8.5)

## 2020-01-05 LAB — LIPID PANEL
Chol/HDL Ratio: 2.5 ratio (ref 0.0–5.0)
Cholesterol, Total: 158 mg/dL (ref 100–199)
HDL: 64 mg/dL (ref >39)
LDL Chol Calc (NIH): 84 mg/dL (ref 0–99)
Triglycerides: 46 mg/dL (ref 0–149)
VLDL Cholesterol Cal: 10 mg/dL (ref 5–40)

## 2020-01-05 LAB — VITAMIN D 25 HYDROXY (VIT D DEFICIENCY, FRACTURES): Vit D, 25-Hydroxy: 68.7 ng/mL (ref 30.0–100.0)

## 2020-01-05 LAB — INSULIN, RANDOM: INSULIN: 10.4 u[IU]/mL (ref 2.6–24.9)

## 2020-01-05 LAB — HEMOGLOBIN A1C
Est. average glucose Bld gHb Est-mCnc: 114 mg/dL
Hgb A1c MFr Bld: 5.6 % (ref 4.8–5.6)

## 2020-01-05 NOTE — Progress Notes (Signed)
Chief Complaint:   OBESITY Andre Morrison is here to discuss his progress with his obesity treatment plan along with follow-up of his obesity related diagnoses. Andre Morrison is on keeping a food journal and adhering to recommended goals of 1800-2000 calories and 125 grams of protein daily and states he is following his eating plan approximately 100% of the time. Andre Morrison states he is walking for 30 minutes 7 times per week.  Today's visit was #: 10 Starting weight: 279 lbs Starting date: 06/29/2019 Today's weight: 239 lbs Today's date: 01/04/2020 Total lbs lost to date: 40 Total lbs lost since last in-office visit: 2  Interim History: Andre Morrison traveled to [place] over the last few weeks. He has been back for one week and was able to get back on the plan. He is journaling regularly.  Subjective:   1. Type 2 diabetes mellitus associated with hypertension ([place]) Andre Morrison is not on medications, and he denies polyphagia.  2. Vitamin D deficiency Andre Morrison is on Vit D daily, and he is walking outside consistently.  3. At risk for heart disease Andre Morrison is at a higher than average risk for cardiovascular disease due to obesity.   Assessment/Plan:   1. Type 2 diabetes mellitus associated with hypertension (HCC) Good blood sugar control is important to decrease the likelihood of diabetic complications such as nephropathy, neuropathy, limb loss, blindness, coronary artery disease, and death. Intensive lifestyle modification including diet, exercise and weight loss are the first line of treatment for diabetes. We will check labs today. Andre Morrison will continue with exercise and weight loss, and will continue to follow up as directed.  - Hemoglobin A1c - Insulin, random - Lipid panel - Comprehensive metabolic panel  2. Vitamin D deficiency Low Vitamin D level contributes to fatigue and are associated with obesity, breast, and colon cancer. We will check labs today. Andre Morrison agreed to continue taking Vit D  2,000 IU daily and will follow-up for routine testing of Vitamin D, at least 2-3 times per year to avoid over-replacement.  - VITAMIN D 25 Hydroxy (Vit-D Deficiency, Fractures)  3. At risk for heart disease Andre Morrison was given approximately 15 minutes of coronary artery disease prevention counseling today. He is 59 y.o. male and has risk factors for heart disease including obesity. We discussed intensive lifestyle modifications today with an emphasis on specific weight loss instructions and strategies.   Repetitive spaced learning was employed today to elicit superior memory formation and behavioral change.  4. Class 1 obesity with serious comorbidity and body mass index (BMI) of 30.0 to 30.9 in adult, unspecified obesity type Andre Morrison is currently in the action stage of change. As such, his goal is to continue with weight loss efforts. He has agreed to the Category 4 Plan or keeping a food journal and adhering to recommended goals of 1800-2000 calories and 130 grams of protein daily.   Exercise goals: As is.  Behavioral modification strategies: meal planning and cooking strategies and celebration eating strategies.  Andre Morrison has agreed to follow-up with our clinic in 3 weeks. He was informed of the importance of frequent follow-up visits to maximize his success with intensive lifestyle modifications for his multiple health conditions.   Andre Morrison was informed we would discuss his lab results at his next visit unless there is a critical issue that needs to be addressed sooner. Andre Morrison agreed to keep his next visit at the agreed upon time to discuss these results.  Objective:   Blood pressure 129/61, pulse 70, temperature 97.9 F (36.6  C), temperature source Oral, height 6\' 3"  (1.905 m), weight 239 lb (108.4 kg), SpO2 95 %. Body mass index is 29.87 kg/m.  General: Cooperative, alert, well developed, in no acute distress. HEENT: Conjunctivae and lids unremarkable. Cardiovascular: Regular rhythm.   Lungs: Normal work of breathing. Neurologic: No focal deficits.   Lab Results  Component Value Date   CREATININE 0.82 01/04/2020   BUN 19 01/04/2020   NA 140 01/04/2020   K 4.4 01/04/2020   CL 101 01/04/2020   CO2 27 01/04/2020   Lab Results  Component Value Date   ALT 17 01/04/2020   AST 15 01/04/2020   ALKPHOS 70 01/04/2020   BILITOT 1.0 01/04/2020   Lab Results  Component Value Date   HGBA1C 5.6 01/04/2020   HGBA1C 6.4 (H) 06/29/2019   HGBA1C 6.2 (H) 02/21/2018   HGBA1C 6.1 (H) 10/12/2007   HGBA1C 9.5 (H) 10/26/2006   Lab Results  Component Value Date   INSULIN 10.4 01/04/2020   INSULIN 30.1 (H) 06/29/2019   Lab Results  Component Value Date   TSH 1.33 06/11/2006   Lab Results  Component Value Date   CHOL 158 01/04/2020   HDL 64 01/04/2020   LDLCALC 84 01/04/2020   TRIG 46 01/04/2020   CHOLHDL 2.5 01/04/2020   Lab Results  Component Value Date   WBC 8.5 02/21/2018   HGB 12.7 (L) 02/21/2018   HCT 40.1 02/21/2018   MCV 96.6 02/21/2018   PLT 129 (L) 02/21/2018   Lab Results  Component Value Date   IRON 97 10/12/2007   Attestation Statements:   Reviewed by clinician on day of visit: allergies, medications, problem list, medical history, surgical history, family history, social history, and previous encounter notes.   Wilhemena Durie, am acting as transcriptionist for [employer], PA-C.  I have reviewed the above documentation for accuracy and completeness, and I agree with the above. [name], PA-C

## 2020-01-25 ENCOUNTER — Other Ambulatory Visit: Payer: Self-pay

## 2020-01-25 ENCOUNTER — Ambulatory Visit (INDEPENDENT_AMBULATORY_CARE_PROVIDER_SITE_OTHER): Payer: BC Managed Care – PPO | Admitting: Physician Assistant

## 2020-01-25 ENCOUNTER — Encounter (INDEPENDENT_AMBULATORY_CARE_PROVIDER_SITE_OTHER): Payer: Self-pay | Admitting: Physician Assistant

## 2020-01-25 VITALS — BP 131/71 | HR 62 | Temp 98.0°F | Ht 75.0 in | Wt 234.0 lb

## 2020-01-25 DIAGNOSIS — Z9189 Other specified personal risk factors, not elsewhere classified: Secondary | ICD-10-CM | POA: Diagnosis not present

## 2020-01-25 DIAGNOSIS — E559 Vitamin D deficiency, unspecified: Secondary | ICD-10-CM

## 2020-01-25 DIAGNOSIS — Z6830 Body mass index (BMI) 30.0-30.9, adult: Secondary | ICD-10-CM

## 2020-01-25 DIAGNOSIS — I10 Essential (primary) hypertension: Secondary | ICD-10-CM | POA: Diagnosis not present

## 2020-01-25 DIAGNOSIS — E669 Obesity, unspecified: Secondary | ICD-10-CM

## 2020-01-25 NOTE — Progress Notes (Signed)
Chief Complaint:   OBESITY Andre Morrison is here to discuss his progress with his obesity treatment plan along with follow-up of his obesity related diagnoses. [facility] is keeping a food journal and adhering to recommended goals of 1800-2000 calories and 125 grams of protein and states he is following his eating plan approximately 100% of the time. Andre Morrison states he is walking 5-7 miles 90 minutes 7 times per week.  Today's visit was #: 11 Starting weight: 279 lbs Starting date: 06/29/2019 Today's weight: 234 lbs Today's date: 01/25/2020 Total lbs lost to date: 45 Total lbs lost since last in-office visit: 5  Interim History: Andre Morrison did very well with weight loss. He continues to journal his food consistently. He is averaging 1800-1900 calories and getting greater than 130 grams of protein daily.  Subjective:   Vitamin D deficiency. Andre Morrison is on OTC Vitamin D 2,000 units daily. His latest labs show value within range.   Ref. Range 01/04/2020 12:17  Vitamin D, 25-Hydroxy Latest Ref Range: 30.0 - 100.0 ng/mL 68.7   Essential hypertension. Andre Morrison is on Cozaar. Blood pressure is normal today. He follows with his PCP.  BP Readings from Last 3 Encounters:  01/25/20 131/71  01/04/20 129/61  12/12/19 133/82   Lab Results  Component Value Date   CREATININE 0.82 01/04/2020   CREATININE 0.90 06/29/2019   CREATININE 1.01 02/21/2018   At risk for heart disease. Andre Morrison is at a higher than average risk for cardiovascular disease due to obesity.   Assessment/Plan:   Vitamin D deficiency. Low Vitamin D level contributes to fatigue and are associated with obesity, breast, and colon cancer. He will decrease his OTC Vitamin D to 1,000 units daily and will follow-up for routine testing of Vitamin D, at least 2-3 times per year to avoid over-replacement.  Essential hypertension. Andre Morrison is working on healthy weight loss and exercise to improve blood pressure control. We will watch for  signs of hypotension as he continues his lifestyle modifications. He will continue his medication as directed and follow-up with his PCP as scheduled.  At risk for heart disease. Andre Morrison was given approximately 15 minutes of coronary artery disease prevention counseling today. He is 59 y.o. male and has risk factors for heart disease including obesity. We discussed intensive lifestyle modifications today with an emphasis on specific weight loss instructions and strategies.   Repetitive spaced learning was employed today to elicit superior memory formation and behavioral change.  Class 1 obesity with serious comorbidity and body mass index (BMI) of 30.0 to 30.9 in adult, unspecified obesity type - BMI greater than 30 at the start of program.  Andre Morrison is currently in the action stage of change. As such, his goal is to continue with weight loss efforts. He has agreed to keeping a food journal and adhering to recommended goals of 1800 calories and 115 grams of protein daily.   Exercise goals: For substantial health benefits, adults should do at least 150 minutes (2 hours and 30 minutes) a week of moderate-intensity, or 75 minutes (1 hour and 15 minutes) a week of vigorous-intensity aerobic physical activity, or an equivalent combination of moderate- and vigorous-intensity aerobic activity. Aerobic activity should be performed in episodes of at least 10 minutes, and preferably, it should be spread throughout the week.  Behavioral modification strategies: planning for success and keeping a strict food journal.  Andre Morrison has agreed to follow-up with our clinic in 3 weeks. He was informed of the importance of frequent follow-up  visits to maximize his success with intensive lifestyle modifications for his multiple health conditions.   Objective:   Blood pressure 131/71, pulse 62, temperature 98 F (36.7 C), height 6\' 3"  (1.905 m), weight 234 lb (106.1 kg), SpO2 98 %. Body mass index is 29.25  kg/m.  General: Cooperative, alert, well developed, in no acute distress. HEENT: Conjunctivae and lids unremarkable. Cardiovascular: Regular rhythm.  Lungs: Normal work of breathing. Neurologic: No focal deficits.   Lab Results  Component Value Date   CREATININE 0.82 01/04/2020   BUN 19 01/04/2020   NA 140 01/04/2020   K 4.4 01/04/2020   CL 101 01/04/2020   CO2 27 01/04/2020   Lab Results  Component Value Date   ALT 17 01/04/2020   AST 15 01/04/2020   ALKPHOS 70 01/04/2020   BILITOT 1.0 01/04/2020   Lab Results  Component Value Date   HGBA1C 5.6 01/04/2020   HGBA1C 6.4 (H) 06/29/2019   HGBA1C 6.2 (H) 02/21/2018   HGBA1C 6.1 (H) 10/12/2007   HGBA1C 9.5 (H) 10/26/2006   Lab Results  Component Value Date   INSULIN 10.4 01/04/2020   INSULIN 30.1 (H) 06/29/2019   Lab Results  Component Value Date   TSH 1.33 06/11/2006   Lab Results  Component Value Date   CHOL 158 01/04/2020   HDL 64 01/04/2020   LDLCALC 84 01/04/2020   TRIG 46 01/04/2020   CHOLHDL 2.5 01/04/2020   Lab Results  Component Value Date   WBC 8.5 02/21/2018   HGB 12.7 (L) 02/21/2018   HCT 40.1 02/21/2018   MCV 96.6 02/21/2018   PLT 129 (L) 02/21/2018   Lab Results  Component Value Date   IRON 97 10/12/2007   Attestation Statements:   Reviewed by clinician on day of visit: allergies, medications, problem list, medical history, surgical history, family history, social history, and previous encounter notes.  Andre Morrison, am acting as transcriptionist for [name], PA-C   I have reviewed the above documentation for accuracy and completeness, and I agree with the above. [name], PA-C

## 2020-02-14 ENCOUNTER — Ambulatory Visit (INDEPENDENT_AMBULATORY_CARE_PROVIDER_SITE_OTHER): Payer: BC Managed Care – PPO | Admitting: Physician Assistant

## 2020-02-15 ENCOUNTER — Ambulatory Visit (INDEPENDENT_AMBULATORY_CARE_PROVIDER_SITE_OTHER): Payer: BC Managed Care – PPO | Admitting: Physician Assistant

## 2020-02-15 ENCOUNTER — Encounter (INDEPENDENT_AMBULATORY_CARE_PROVIDER_SITE_OTHER): Payer: Self-pay | Admitting: Physician Assistant

## 2020-02-15 ENCOUNTER — Other Ambulatory Visit: Payer: Self-pay

## 2020-02-15 VITALS — BP 133/71 | HR 59 | Temp 98.0°F | Ht 75.0 in | Wt 229.0 lb

## 2020-02-15 DIAGNOSIS — E559 Vitamin D deficiency, unspecified: Secondary | ICD-10-CM | POA: Diagnosis not present

## 2020-02-15 DIAGNOSIS — E1159 Type 2 diabetes mellitus with other circulatory complications: Secondary | ICD-10-CM | POA: Diagnosis not present

## 2020-02-15 DIAGNOSIS — I152 Hypertension secondary to endocrine disorders: Secondary | ICD-10-CM | POA: Diagnosis not present

## 2020-02-15 DIAGNOSIS — Z6830 Body mass index (BMI) 30.0-30.9, adult: Secondary | ICD-10-CM

## 2020-02-15 DIAGNOSIS — E669 Obesity, unspecified: Secondary | ICD-10-CM

## 2020-02-15 NOTE — Progress Notes (Signed)
Chief Complaint:   OBESITY Andre Morrison is here to discuss his progress with his obesity treatment plan along with follow-up of his obesity related diagnoses. Andre Morrison is on the Category 4 Plan and states he is following his eating plan approximately 100% of the time. Andre Morrison states he is walking 120 minutes 7 times per week.  Today's visit was #: 12 Starting weight: 279 lbs Starting date: 06/29/2019 Today's weight: 229 lbs Today's date: 02/15/2020 Total lbs lost to date: 50 Total lbs lost since last in-office visit: 5  Interim History: Andre Morrison continues to do well with weight loss. He is finding that he can enjoy a few treat meals and then get back on the plan and do well. He is traveling to the mountains this weekend. His goal is to get to 220 lbs. He will have labs drawn with his PCP in December.  Subjective:   Type 2 diabetes mellitus associated with hypertension ([place]). Last A1c 5.6. Andre Morrison is on no medications and reports no hypoglycemia. He is exercising regularly.   Lab Results  Component Value Date   HGBA1C 5.6 01/04/2020   HGBA1C 6.4 (H) 06/29/2019   HGBA1C 6.2 (H) 02/21/2018   Lab Results  Component Value Date   LDLCALC 84 01/04/2020   CREATININE 0.82 01/04/2020   Lab Results  Component Value Date   INSULIN 10.4 01/04/2020   INSULIN 30.1 (H) 06/29/2019   Vitamin D deficiency. Andre Morrison is on Vitamin D 2,000 units every other day. Last level was at goal.   Ref. Range 01/04/2020 12:17  Vitamin D, 25-Hydroxy Latest Ref Range: 30.0 - 100.0 ng/mL 68.7   Assessment/Plan:   Type 2 diabetes mellitus associated with hypertension ([place]). Good blood sugar control is important to decrease the likelihood of diabetic complications such as nephropathy, neuropathy, limb loss, blindness, coronary artery disease, and death. Intensive lifestyle modification including diet, exercise and weight loss are the first line of treatment for diabetes.   Vitamin D deficiency. Low  Vitamin D level contributes to fatigue and are associated with obesity, breast, and colon cancer. He agrees to continue to take Vitamin D as directed and will follow-up for routine testing of Vitamin D, at least 2-3 times per year to avoid over-replacement.  Class 1 obesity with serious comorbidity and body mass index (BMI) of 30.0 to 30.9 in adult, unspecified obesity type.  Andre Morrison is currently in the action stage of change. As such, his goal is to continue with weight loss efforts. He has agreed to the Category 4 Plan and will journal 1800 calories and 115 grams of protein.   Exercise goals: For substantial health benefits, adults should do at least 150 minutes (2 hours and 30 minutes) a week of moderate-intensity, or 75 minutes (1 hour and 15 minutes) a week of vigorous-intensity aerobic physical activity, or an equivalent combination of moderate- and vigorous-intensity aerobic activity. Aerobic activity should be performed in episodes of at least 10 minutes, and preferably, it should be spread throughout the week.  Behavioral modification strategies: planning for success and keeping a strict food journal.  Andre Morrison has agreed to follow-up with our clinic in 3 weeks. He was informed of the importance of frequent follow-up visits to maximize his success with intensive lifestyle modifications for his multiple health conditions.   Objective:   Blood pressure 133/71, pulse (!) 59, temperature 98 F (36.7 C), height 6\' 3"  (1.905 m), weight 229 lb (103.9 kg), SpO2 99 %. Body mass index is 28.62 kg/m.  General: Cooperative, alert, well developed, in no acute distress. HEENT: Conjunctivae and lids unremarkable. Cardiovascular: Regular rhythm.  Lungs: Normal work of breathing. Neurologic: No focal deficits.   Lab Results  Component Value Date   CREATININE 0.82 01/04/2020   BUN 19 01/04/2020   NA 140 01/04/2020   K 4.4 01/04/2020   CL 101 01/04/2020   CO2 27 01/04/2020   Lab Results    Component Value Date   ALT 17 01/04/2020   AST 15 01/04/2020   ALKPHOS 70 01/04/2020   BILITOT 1.0 01/04/2020   Lab Results  Component Value Date   HGBA1C 5.6 01/04/2020   HGBA1C 6.4 (H) 06/29/2019   HGBA1C 6.2 (H) 02/21/2018   HGBA1C 6.1 (H) 10/12/2007   HGBA1C 9.5 (H) 10/26/2006   Lab Results  Component Value Date   INSULIN 10.4 01/04/2020   INSULIN 30.1 (H) 06/29/2019   Lab Results  Component Value Date   TSH 1.33 06/11/2006   Lab Results  Component Value Date   CHOL 158 01/04/2020   HDL 64 01/04/2020   LDLCALC 84 01/04/2020   TRIG 46 01/04/2020   CHOLHDL 2.5 01/04/2020   Lab Results  Component Value Date   WBC 8.5 02/21/2018   HGB 12.7 (L) 02/21/2018   HCT 40.1 02/21/2018   MCV 96.6 02/21/2018   PLT 129 (L) 02/21/2018   Lab Results  Component Value Date   IRON 97 10/12/2007   Attestation Statements:   Reviewed by clinician on day of visit: allergies, medications, problem list, medical history, surgical history, family history, social history, and previous encounter notes.  Time spent on visit including pre-visit chart review and post-visit charting and care was 30 minutes.   [name], am acting as transcriptionist for [name], PA-C   I have reviewed the above documentation for accuracy and completeness, and I agree with the above. [name], PA-C

## 2020-03-07 ENCOUNTER — Ambulatory Visit (INDEPENDENT_AMBULATORY_CARE_PROVIDER_SITE_OTHER): Payer: BC Managed Care – PPO | Admitting: Physician Assistant

## 2020-03-07 ENCOUNTER — Encounter (INDEPENDENT_AMBULATORY_CARE_PROVIDER_SITE_OTHER): Payer: Self-pay | Admitting: Physician Assistant

## 2020-03-07 ENCOUNTER — Other Ambulatory Visit: Payer: Self-pay

## 2020-03-07 VITALS — BP 125/66 | HR 57 | Temp 98.3°F | Ht 75.0 in | Wt 227.0 lb

## 2020-03-07 DIAGNOSIS — E669 Obesity, unspecified: Secondary | ICD-10-CM | POA: Diagnosis not present

## 2020-03-07 DIAGNOSIS — I152 Hypertension secondary to endocrine disorders: Secondary | ICD-10-CM | POA: Diagnosis not present

## 2020-03-07 DIAGNOSIS — I10 Essential (primary) hypertension: Secondary | ICD-10-CM

## 2020-03-07 DIAGNOSIS — Z6830 Body mass index (BMI) 30.0-30.9, adult: Secondary | ICD-10-CM

## 2020-03-07 DIAGNOSIS — E1159 Type 2 diabetes mellitus with other circulatory complications: Secondary | ICD-10-CM

## 2020-03-12 NOTE — Progress Notes (Signed)
Chief Complaint:   OBESITY Andre Morrison is here to discuss his progress with his obesity treatment plan along with follow-up of his obesity related diagnoses. [name] is on the Category 4 Plan and states he is following his eating plan approximately 100% of the time. [name] states he is walking 3.5 miles 7 times per week.  Today's visit was #: 13 Starting weight: 279 lbs Starting date: 06/29/2019 Today's weight: 227 lbs Today's date: 03/07/2020 Total lbs lost to date: 52 Total lbs lost since last in-office visit: 2  Interim History: [name] continues to do great with weight loss. He did not have any challenges with the plan the last few weeks. He is traveling to his mother-in-law's for Thanksgiving.  Subjective:   Essential hypertension. Blood pressure is controlled on Cozaar. No  chest pain or headache. He is managed by his PCP.  BP Readings from Last 3 Encounters:  03/07/20 125/66  02/15/20 133/71  01/25/20 131/71   Lab Results  Component Value Date   CREATININE 0.82 01/04/2020   CREATININE 0.90 06/29/2019   CREATININE 1.01 02/21/2018   Type 2 diabetes mellitus associated with hypertension ([place]). Last A1c 5.6. Andre Morrison is on no medication. No polyphagia. He is exercising regularly.   Lab Results  Component Value Date   HGBA1C 5.6 01/04/2020   HGBA1C 6.4 (H) 06/29/2019   HGBA1C 6.2 (H) 02/21/2018   Lab Results  Component Value Date   LDLCALC 84 01/04/2020   CREATININE 0.82 01/04/2020   Lab Results  Component Value Date   INSULIN 10.4 01/04/2020   INSULIN 30.1 (H) 06/29/2019   Assessment/Plan:   Essential hypertension. [name] is working on healthy weight loss and exercise to improve blood pressure control. We will watch for signs of hypotension as he continues his lifestyle modifications. He will follow-up with his PCP as scheduled and labs will be drawn next month by his PCP.  Type 2 diabetes mellitus associated with hypertension ([name]). Good blood sugar  control is important to decrease the likelihood of diabetic complications such as nephropathy, neuropathy, limb loss, blindness, coronary artery disease, and death. Intensive lifestyle modification including diet, exercise and weight loss are the first line of treatment for diabetes.   Class 1 obesity with serious comorbidity and body mass index (BMI) of 30.0 to 30.9 in adult, unspecified obesity type - BMI greater than 30 at the start of the program.  Andre Morrison is currently in the action stage of change. As such, his goal is to continue with weight loss efforts. He has agreed to keeping a food journal and adhering to recommended goals of 1800-1900 calories and 115 grams of protein daily.   Exercise goals: For substantial health benefits, adults should do at least 150 minutes (2 hours and 30 minutes) a week of moderate-intensity, or 75 minutes (1 hour and 15 minutes) a week of vigorous-intensity aerobic physical activity, or an equivalent combination of moderate- and vigorous-intensity aerobic activity. Aerobic activity should be performed in episodes of at least 10 minutes, and preferably, it should be spread throughout the week.  Behavioral modification strategies: meal planning and cooking strategies and holiday eating strategies .  [name] has agreed to follow-up with our clinic in 3 weeks. He was informed of the importance of frequent follow-up visits to maximize his success with intensive lifestyle modifications for his multiple health conditions.   Objective:   Blood pressure 125/66, pulse (!) 57, temperature 98.3 F (36.8 C), temperature source Oral, height 6\' 3"  (1.905 m), weight  227 lb (103 kg), SpO2 97 %. Body mass index is 28.37 kg/m.  General: Cooperative, alert, well developed, in no acute distress. HEENT: Conjunctivae and lids unremarkable. Cardiovascular: Regular rhythm.  Lungs: Normal work of breathing. Neurologic: No focal deficits.   Lab Results  Component Value Date    CREATININE 0.82 01/04/2020   BUN 19 01/04/2020   NA 140 01/04/2020   K 4.4 01/04/2020   CL 101 01/04/2020   CO2 27 01/04/2020   Lab Results  Component Value Date   ALT 17 01/04/2020   AST 15 01/04/2020   ALKPHOS 70 01/04/2020   BILITOT 1.0 01/04/2020   Lab Results  Component Value Date   HGBA1C 5.6 01/04/2020   HGBA1C 6.4 (H) 06/29/2019   HGBA1C 6.2 (H) 02/21/2018   HGBA1C 6.1 (H) 10/12/2007   HGBA1C 9.5 (H) 10/26/2006   Lab Results  Component Value Date   INSULIN 10.4 01/04/2020   INSULIN 30.1 (H) 06/29/2019   Lab Results  Component Value Date   TSH 1.33 06/11/2006   Lab Results  Component Value Date   CHOL 158 01/04/2020   HDL 64 01/04/2020   LDLCALC 84 01/04/2020   TRIG 46 01/04/2020   CHOLHDL 2.5 01/04/2020   Lab Results  Component Value Date   WBC 8.5 02/21/2018   HGB 12.7 (L) 02/21/2018   HCT 40.1 02/21/2018   MCV 96.6 02/21/2018   PLT 129 (L) 02/21/2018   Lab Results  Component Value Date   IRON 97 10/12/2007   Attestation Statements:   Reviewed by clinician on day of visit: allergies, medications, problem list, medical history, surgical history, family history, social history, and previous encounter notes.  Time spent on visit including pre-visit chart review and post-visit charting and care was 36 minutes.   [name], am acting as transcriptionist for [name], PA-C   I have reviewed the above documentation for accuracy and completeness, and I agree with the above. [name], PA-C

## 2020-03-28 ENCOUNTER — Encounter (INDEPENDENT_AMBULATORY_CARE_PROVIDER_SITE_OTHER): Payer: Self-pay | Admitting: Physician Assistant

## 2020-03-28 ENCOUNTER — Other Ambulatory Visit: Payer: Self-pay

## 2020-03-28 ENCOUNTER — Ambulatory Visit (INDEPENDENT_AMBULATORY_CARE_PROVIDER_SITE_OTHER): Payer: BC Managed Care – PPO | Admitting: Physician Assistant

## 2020-03-28 VITALS — BP 131/60 | HR 57 | Temp 97.6°F | Ht 75.0 in | Wt 225.0 lb

## 2020-03-28 DIAGNOSIS — E669 Obesity, unspecified: Secondary | ICD-10-CM | POA: Diagnosis not present

## 2020-03-28 DIAGNOSIS — E1169 Type 2 diabetes mellitus with other specified complication: Secondary | ICD-10-CM | POA: Diagnosis not present

## 2020-03-28 DIAGNOSIS — Z6830 Body mass index (BMI) 30.0-30.9, adult: Secondary | ICD-10-CM

## 2020-03-28 DIAGNOSIS — E1159 Type 2 diabetes mellitus with other circulatory complications: Secondary | ICD-10-CM

## 2020-03-28 DIAGNOSIS — E559 Vitamin D deficiency, unspecified: Secondary | ICD-10-CM

## 2020-03-28 DIAGNOSIS — I152 Hypertension secondary to endocrine disorders: Secondary | ICD-10-CM

## 2020-04-02 NOTE — Progress Notes (Signed)
Chief Complaint:   OBESITY Andre Morrison is here to discuss his progress with his obesity treatment plan along with follow-up of his obesity related diagnoses. Andre Morrison is on the Category 4 Plan and states he is following his eating plan approximately 100% of the time. Andre Morrison states he is walking 3 miles a day for 1 hour and 15 minutes 5 to 7 times per week.  Today's visit was #: 14 Starting weight: 279 lbs Starting date: 06/29/19 Today's weight: 225 lbs Today's date: 03/28/2020 Total lbs lost to date: 54 Total lbs lost since last in-office visit: 2  Interim History: Andre Morrison reports that he ate a lot of protein for Thanksgiving and indulged in some desserts. He has been following the plan very well. His hunger is controlled.  Subjective:   1. Vitamin D deficiency Andre Morrison's Vitamin D level was 68.7 on 01/04/20. He is currently taking OTC vitamin D 2000 UT each day. His vitamin D will be checked with his PCP in the next few weeks. He denies nausea, vomiting or muscle weakness.  2.Type 2 diabetes mellitus with other specified complication, without long-term current use of insulin, with hypertension Medications reviewed. [name] is not checking his blood sugar at home. His last A1c was 5.6 and he is not currently on medication.  Lab Results  Component Value Date   HGBA1C 5.6 01/04/2020   HGBA1C 6.4 (H) 06/29/2019   HGBA1C 6.2 (H) 02/21/2018   Lab Results  Component Value Date   LDLCALC 84 01/04/2020   CREATININE 0.82 01/04/2020   Lab Results  Component Value Date   INSULIN 10.4 01/04/2020   INSULIN 30.1 (H) 06/29/2019   Assessment/Plan:   1. Vitamin D deficiency Low Vitamin D level contributes to fatigue and are associated with obesity, breast, and colon cancer. He agrees to continue to take OTC vitamin D 2000 UT and will follow-up for routine testing of Vitamin D, at least 2-3 times per year to avoid over-replacement.  2.Type 2 diabetes mellitus with other specified  complication, without long-term current use of insulin, with hypertension Good blood sugar control is important to decrease the likelihood of diabetic complications such as nephropathy, neuropathy, limb loss, blindness, coronary artery disease, and death. Intensive lifestyle modification including diet, exercise and weight loss are the first line of treatment for diabetes. Andre Morrison's PCP will check his A1c.  3. Class 1 obesity with serious comorbidity and body mass index (BMI) of 30.0 to 30.9 in adult, unspecified obesity type Andre Morrison is currently in the action stage of change. As such, his goal is to continue with weight loss efforts. He has agreed to the Category 4 Plan.   Exercise goals: As is.  Behavioral modification strategies: meal planning and cooking strategies and holiday eating strategies .  Andre Morrison has agreed to follow-up with our clinic in 4 weeks. He was informed of the importance of frequent follow-up visits to maximize his success with intensive lifestyle modifications for his multiple health conditions.   Objective:   Blood pressure 131/60, pulse (!) 57, temperature 97.6 F (36.4 C), height 6\' 3"  (1.905 m), weight 225 lb (102.1 kg), SpO2 99 %. Body mass index is 28.12 kg/m.  General: Cooperative, alert, well developed, in no acute distress. HEENT: Conjunctivae and lids unremarkable. Cardiovascular: Regular rhythm.  Lungs: Normal work of breathing. Neurologic: No focal deficits.   Lab Results  Component Value Date   CREATININE 0.82 01/04/2020   BUN 19 01/04/2020   NA 140 01/04/2020   K 4.4 01/04/2020  CL 101 01/04/2020   CO2 27 01/04/2020   Lab Results  Component Value Date   ALT 17 01/04/2020   AST 15 01/04/2020   ALKPHOS 70 01/04/2020   BILITOT 1.0 01/04/2020   Lab Results  Component Value Date   HGBA1C 5.6 01/04/2020   HGBA1C 6.4 (H) 06/29/2019   HGBA1C 6.2 (H) 02/21/2018   HGBA1C 6.1 (H) 10/12/2007   HGBA1C 9.5 (H) 10/26/2006   Lab Results   Component Value Date   INSULIN 10.4 01/04/2020   INSULIN 30.1 (H) 06/29/2019   Lab Results  Component Value Date   TSH 1.33 06/11/2006   Lab Results  Component Value Date   CHOL 158 01/04/2020   HDL 64 01/04/2020   LDLCALC 84 01/04/2020   TRIG 46 01/04/2020   CHOLHDL 2.5 01/04/2020   Lab Results  Component Value Date   WBC 8.5 02/21/2018   HGB 12.7 (L) 02/21/2018   HCT 40.1 02/21/2018   MCV 96.6 02/21/2018   PLT 129 (L) 02/21/2018   Lab Results  Component Value Date   IRON 97 10/12/2007   Attestation Statements:   Reviewed by clinician on day of visit: allergies, medications, problem list, medical history, surgical history, family history, social history, and previous encounter notes.  Time spent on visit including pre-visit chart review and post-visit care and charting was 45 minutes.   Lenward Chancellor, CMA, am acting as transcriptionist for [name], PA-C   I have reviewed the above documentation for accuracy and completeness, and I agree with the above. [name], PA-C

## 2020-04-08 ENCOUNTER — Encounter (INDEPENDENT_AMBULATORY_CARE_PROVIDER_SITE_OTHER): Payer: Self-pay | Admitting: Physician Assistant

## 2020-04-25 ENCOUNTER — Encounter (INDEPENDENT_AMBULATORY_CARE_PROVIDER_SITE_OTHER): Payer: Self-pay | Admitting: Physician Assistant

## 2020-04-25 ENCOUNTER — Telehealth (INDEPENDENT_AMBULATORY_CARE_PROVIDER_SITE_OTHER): Payer: BC Managed Care – PPO | Admitting: Physician Assistant

## 2020-04-25 DIAGNOSIS — E669 Obesity, unspecified: Secondary | ICD-10-CM

## 2020-04-25 DIAGNOSIS — Z6830 Body mass index (BMI) 30.0-30.9, adult: Secondary | ICD-10-CM

## 2020-04-25 DIAGNOSIS — I10 Essential (primary) hypertension: Secondary | ICD-10-CM

## 2020-04-25 DIAGNOSIS — E1169 Type 2 diabetes mellitus with other specified complication: Secondary | ICD-10-CM

## 2020-04-26 NOTE — Progress Notes (Signed)
TeleHealth Visit:  Due to the COVID-19 pandemic, this visit was completed with telemedicine (audio/video) technology to reduce patient and provider exposure as well as to preserve personal protective equipment.   Andre Morrison has verbally consented to this TeleHealth visit. The patient is located at home, the provider is located at the [facility] office. The participants in this visit include the listed provider and patient. The visit was conducted today via MyChart video.   Chief Complaint: OBESITY Andre Morrison is here to discuss his progress with his obesity treatment plan along with follow-up of his obesity related diagnoses. Andre Morrison is on the Category 4 Plan and states he is following his eating plan approximately 40% of the time. Andre Morrison states he is doing 0 minutes 0 times per week.  Today's visit was #: 15 Starting weight: 279 lbs Starting date: 06/29/2019  Interim History: Andre Morrison states that he gained some weight over the holiday, but has since then lost it. He reports that his at home weight today was 222 lbs.  Subjective:   1. Type 2 diabetes mellitus with other specified complication, without long-term current use of insulin Andre Morrison is not on medications, and his most recent A1c was 5.6 which is very well controlled.  2. Essential hypertension Andre Morrison is on losartan, and he denies chest pain or headache. He is exercising less recently due to the holidays.  Assessment/Plan:   1. Type 2 diabetes mellitus with other specified complication, without long-term current use of insulin Good blood sugar control is important to decrease the likelihood of diabetic complications such as nephropathy, neuropathy, limb loss, blindness, coronary artery disease, and death. Intensive lifestyle modification including diet, exercise and weight loss are the first line of treatment for diabetes. Andre Morrison will continue with weight loss, and will follow up as directed.  2. Essential  hypertension Andre Morrison will continue with exercise and healthy weight loss to improve blood pressure control. We will watch for signs of hypotension as he continues his lifestyle modifications.  3. Class 1 obesity with serious comorbidity and body mass index (BMI) of 30.0 to 30.9 in adult, unspecified obesity type Andre Morrison is currently in the action stage of change. As such, his goal is to continue with weight loss efforts. He has agreed to the Category 4 Plan.   Exercise goals: No exercise has been prescribed at this time.  Behavioral modification strategies: meal planning and cooking strategies and planning for success.  Andre Morrison has agreed to follow-up with our clinic in 2 to 3 weeks. He was informed of the importance of frequent follow-up visits to maximize his success with intensive lifestyle modifications for his multiple health conditions.  Objective:   VITALS: Per patient if applicable, see vitals. GENERAL: Alert and in no acute distress. CARDIOPULMONARY: No increased WOB. Speaking in clear sentences.  PSYCH: Pleasant and cooperative. Speech normal rate and rhythm. Affect is appropriate. Insight and judgement are appropriate. Attention is focused, linear, and appropriate.  NEURO: Oriented as arrived to appointment on time with no prompting.   Lab Results  Component Value Date   CREATININE 0.82 01/04/2020   BUN 19 01/04/2020   NA 140 01/04/2020   K 4.4 01/04/2020   CL 101 01/04/2020   CO2 27 01/04/2020   Lab Results  Component Value Date   ALT 17 01/04/2020   AST 15 01/04/2020   ALKPHOS 70 01/04/2020   BILITOT 1.0 01/04/2020   Lab Results  Component Value Date   HGBA1C 5.6 01/04/2020   HGBA1C 6.4 (H)  06/29/2019   HGBA1C 6.2 (H) 02/21/2018   HGBA1C 6.1 (H) 10/12/2007   HGBA1C 9.5 (H) 10/26/2006   Lab Results  Component Value Date   INSULIN 10.4 01/04/2020   INSULIN 30.1 (H) 06/29/2019   Lab Results  Component Value Date   TSH 1.33 06/11/2006   Lab Results   Component Value Date   CHOL 158 01/04/2020   HDL 64 01/04/2020   LDLCALC 84 01/04/2020   TRIG 46 01/04/2020   CHOLHDL 2.5 01/04/2020   Lab Results  Component Value Date   WBC 8.5 02/21/2018   HGB 12.7 (L) 02/21/2018   HCT 40.1 02/21/2018   MCV 96.6 02/21/2018   PLT 129 (L) 02/21/2018   Lab Results  Component Value Date   IRON 97 10/12/2007    Attestation Statements:   Reviewed by clinician on day of visit: allergies, medications, problem list, medical history, surgical history, family history, social history, and previous encounter notes.   Trude Mcburney, am acting as transcriptionist for [employer], PA-C.  I have reviewed the above documentation for accuracy and completeness, and I agree with the above. Alois Cliche, PA-C

## 2020-05-14 ENCOUNTER — Other Ambulatory Visit: Payer: Self-pay

## 2020-05-14 ENCOUNTER — Telehealth (INDEPENDENT_AMBULATORY_CARE_PROVIDER_SITE_OTHER): Payer: BC Managed Care – PPO | Admitting: Physician Assistant

## 2020-05-14 ENCOUNTER — Encounter (INDEPENDENT_AMBULATORY_CARE_PROVIDER_SITE_OTHER): Payer: Self-pay | Admitting: Physician Assistant

## 2020-05-14 DIAGNOSIS — E1169 Type 2 diabetes mellitus with other specified complication: Secondary | ICD-10-CM | POA: Diagnosis not present

## 2020-05-14 DIAGNOSIS — E669 Obesity, unspecified: Secondary | ICD-10-CM | POA: Diagnosis not present

## 2020-05-14 DIAGNOSIS — Z6830 Body mass index (BMI) 30.0-30.9, adult: Secondary | ICD-10-CM

## 2020-05-14 DIAGNOSIS — E559 Vitamin D deficiency, unspecified: Secondary | ICD-10-CM | POA: Diagnosis not present

## 2020-05-14 NOTE — Progress Notes (Unsigned)
TeleHealth Visit:  Due to the COVID-19 pandemic, this visit was completed with telemedicine (audio/video) technology to reduce patient and provider exposure as well as to preserve personal protective equipment.   Andre Morrison has verbally consented to this TeleHealth visit. The patient is located at home, the provider is located at the [facility]. The participants in this visit include the listed provider and patient. The visit was conducted today via MyChart video.   Chief Complaint: OBESITY Andre Morrison is here to discuss his progress with his obesity treatment plan along with follow-up of his obesity related diagnoses. Andre Morrison is on the Category 4 Plan and states he is following his eating plan approximately 50% of the time. Andre Morrison states he is doing 0 minutes 0 times per week.  Today's visit was #: 16 Starting weight: 279 lbs Starting date: 06/29/2019  Interim History: Andre Morrison's most recent weight was 222 lbs. His son was diagnosed with COVID recently and he is at home today due to recent exposure. He has not been exercising due to cold weather. He has been off the plan some, and enjoying cookie dough during the recent snowfall. His primary care physician recently did labs in December 2021.  Subjective:   1. Vitamin D deficiency Andre Morrison is on Vit D daily. Last Vit D level was at goal. His energy is good.  2. Type 2 diabetes mellitus with other specified complication, without long-term current use of insulin Andre Morrison's primary care physician performed his last A1c in December (unable to view results). Last A1c in our office in 12/2019 was 5.6. He is not on medications, and he denies polyphagia.  Assessment/Plan:   1. Vitamin D deficiency Low Vitamin D level contributes to fatigue and are associated with obesity, breast, and colon cancer. Andre Morrison agreed to continue taking OTC Vitamin D and will follow-up for routine testing of Vitamin D, at least 2-3 times per year to  avoid over-replacement.  2. Type 2 diabetes mellitus with other specified complication, without long-term current use of insulin Good blood sugar control is important to decrease the likelihood of diabetic complications such as nephropathy, neuropathy, limb loss, blindness, coronary artery disease, and death. Intensive lifestyle modification including diet, exercise and weight loss are the first line of treatment for diabetes. Andre Morrison will continue his meal plan, and will follow up as directed.  3. Class 1 obesity with serious comorbidity and body mass index (BMI) of 30.0 to 30.9 in adult, unspecified obesity type Andre Morrison is currently in the action stage of change. As such, his goal is to continue with weight loss efforts. He has agreed to the Category 4 Plan.   Exercise goals: No exercise has been prescribed at this time.  Behavioral modification strategies: meal planning and cooking strategies and planning for success.  Andre Morrison has agreed to follow-up with our clinic in 2 weeks. He was informed of the importance of frequent follow-up visits to maximize his success with intensive lifestyle modifications for his multiple health conditions.  Objective:   VITALS: Per patient if applicable, see vitals. GENERAL: Alert and in no acute distress. CARDIOPULMONARY: No increased WOB. Speaking in clear sentences.  PSYCH: Pleasant and cooperative. Speech normal rate and rhythm. Affect is appropriate. Insight and judgement are appropriate. Attention is focused, linear, and appropriate.  NEURO: Oriented as arrived to appointment on time with no prompting.   Lab Results  Component Value Date   CREATININE 0.82 01/04/2020   BUN 19 01/04/2020   NA 140 01/04/2020   K  4.4 01/04/2020   CL 101 01/04/2020   CO2 27 01/04/2020   Lab Results  Component Value Date   ALT 17 01/04/2020   AST 15 01/04/2020   ALKPHOS 70 01/04/2020   BILITOT 1.0 01/04/2020   Lab Results  Component Value Date   HGBA1C 5.6  01/04/2020   HGBA1C 6.4 (H) 06/29/2019   HGBA1C 6.2 (H) 02/21/2018   HGBA1C 6.1 (H) 10/12/2007   HGBA1C 9.5 (H) 10/26/2006   Lab Results  Component Value Date   INSULIN 10.4 01/04/2020   INSULIN 30.1 (H) 06/29/2019   Lab Results  Component Value Date   TSH 1.33 06/11/2006   Lab Results  Component Value Date   CHOL 158 01/04/2020   HDL 64 01/04/2020   LDLCALC 84 01/04/2020   TRIG 46 01/04/2020   CHOLHDL 2.5 01/04/2020   Lab Results  Component Value Date   WBC 8.5 02/21/2018   HGB 12.7 (L) 02/21/2018   HCT 40.1 02/21/2018   MCV 96.6 02/21/2018   PLT 129 (L) 02/21/2018   Lab Results  Component Value Date   IRON 97 10/12/2007    Attestation Statements:   Reviewed by clinician on day of visit: allergies, medications, problem list, medical history, surgical history, family history, social history, and previous encounter notes.   Wilhemena Durie, am acting as transcriptionist for [employer], PA-C.  I have reviewed the above documentation for accuracy and completeness, and I agree with the above. [name], PA-C

## 2020-05-28 ENCOUNTER — Ambulatory Visit (INDEPENDENT_AMBULATORY_CARE_PROVIDER_SITE_OTHER): Payer: BC Managed Care – PPO | Admitting: Physician Assistant

## 2020-05-28 ENCOUNTER — Encounter (INDEPENDENT_AMBULATORY_CARE_PROVIDER_SITE_OTHER): Payer: Self-pay

## 2020-05-30 ENCOUNTER — Ambulatory Visit (INDEPENDENT_AMBULATORY_CARE_PROVIDER_SITE_OTHER): Payer: BC Managed Care – PPO | Admitting: Physician Assistant

## 2020-05-30 ENCOUNTER — Encounter (INDEPENDENT_AMBULATORY_CARE_PROVIDER_SITE_OTHER): Payer: Self-pay | Admitting: Physician Assistant

## 2020-05-30 ENCOUNTER — Other Ambulatory Visit: Payer: Self-pay

## 2020-05-30 VITALS — BP 147/73 | HR 56 | Temp 97.8°F | Ht 75.0 in | Wt 229.0 lb

## 2020-05-30 DIAGNOSIS — I10 Essential (primary) hypertension: Secondary | ICD-10-CM

## 2020-05-30 DIAGNOSIS — Z6830 Body mass index (BMI) 30.0-30.9, adult: Secondary | ICD-10-CM

## 2020-05-30 DIAGNOSIS — E669 Obesity, unspecified: Secondary | ICD-10-CM

## 2020-05-30 DIAGNOSIS — E1169 Type 2 diabetes mellitus with other specified complication: Secondary | ICD-10-CM

## 2020-05-30 NOTE — Progress Notes (Signed)
Chief Complaint:   OBESITY Andre Morrison is here to discuss his progress with his obesity treatment plan along with follow-up of his obesity related diagnoses. Andre Morrison is on the Category 4 Plan and states he is following his eating plan approximately 100% of the time. Andre Morrison states he is walking for 45 minutes 1 time per week.  Today's visit was #: 76 Starting weight: 279 lbs Starting date: 06/29/2019 Today's weight: 229 lbs Today's date: 05/30/2020 Total lbs lost to date: 50 Total lbs lost since last in-office visit: 0  Interim History: Andre Morrison is averaging between 1780 and 1830 calories and getting between 135-170 grams of protein. He is frustrated with his weight gain today. His activity has changed over the winter but as it gets warmer he will increase his walking.  Subjective:   1. Type 2 diabetes mellitus with other specified complication, without long-term current use of insulin Andre Morrison is not on medications, and he is not checking BGs at home. Last A1c was 5.6.  2. Essential hypertension Andre Morrison's blood pressure is controlled. He is not on medications, and he is managed by his primary care physician.  Assessment/Plan:   1. Type 2 diabetes mellitus with other specified complication, without long-term current use of insulin Good blood sugar control is important to decrease the likelihood of diabetic complications such as nephropathy, neuropathy, limb loss, blindness, coronary artery disease, and death. Intensive lifestyle modification including diet, exercise and weight loss are the first line of treatment for diabetes. We will recheck A1c at his next visit.  2. Essential hypertension Andre Morrison is working on healthy weight loss and diet to improve blood pressure control. We will watch for signs of hypotension as he continues his lifestyle modifications. Andre Morrison will increase exercise and will follow up with his primary care physician.  3. Class 1 obesity with serious comorbidity  and body mass index (BMI) of 30.0 to 30.9 in adult, unspecified obesity type Andre Morrison is currently in the action stage of change. As such, his goal is to continue with weight loss efforts. He has agreed to keeping a food journal and adhering to recommended goals of 1900-2000 calories and 135 grams of protein daily.   We will recheck IC and labs at his next visit.  Exercise goals: As is.  Behavioral modification strategies: meal planning and cooking strategies and keeping healthy foods in the home.  Andre Morrison has agreed to follow-up with our clinic in 3 weeks. He was informed of the importance of frequent follow-up visits to maximize his success with intensive lifestyle modifications for his multiple health conditions.   Objective:   Blood pressure (!) 147/73, pulse (!) 56, temperature 97.8 F (36.6 C), height 6\' 3"  (1.905 m), weight 229 lb (103.9 kg), SpO2 98 %. Body mass index is 28.62 kg/m.  General: Cooperative, alert, well developed, in no acute distress. HEENT: Conjunctivae and lids unremarkable. Cardiovascular: Regular rhythm.  Lungs: Normal work of breathing. Neurologic: No focal deficits.   Lab Results  Component Value Date   CREATININE 0.82 01/04/2020   BUN 19 01/04/2020   NA 140 01/04/2020   K 4.4 01/04/2020   CL 101 01/04/2020   CO2 27 01/04/2020   Lab Results  Component Value Date   ALT 17 01/04/2020   AST 15 01/04/2020   ALKPHOS 70 01/04/2020   BILITOT 1.0 01/04/2020   Lab Results  Component Value Date   HGBA1C 5.6 01/04/2020   HGBA1C 6.4 (H) 06/29/2019   HGBA1C 6.2 (H) 02/21/2018   HGBA1C  6.1 (H) 10/12/2007   HGBA1C 9.5 (H) 10/26/2006   Lab Results  Component Value Date   INSULIN 10.4 01/04/2020   INSULIN 30.1 (H) 06/29/2019   Lab Results  Component Value Date   TSH 1.33 06/11/2006   Lab Results  Component Value Date   CHOL 158 01/04/2020   HDL 64 01/04/2020   LDLCALC 84 01/04/2020   TRIG 46 01/04/2020   CHOLHDL 2.5 01/04/2020   Lab Results   Component Value Date   WBC 8.5 02/21/2018   HGB 12.7 (L) 02/21/2018   HCT 40.1 02/21/2018   MCV 96.6 02/21/2018   PLT 129 (L) 02/21/2018   Lab Results  Component Value Date   IRON 97 10/12/2007   Attestation Statements:   Reviewed by clinician on day of visit: allergies, medications, problem list, medical history, surgical history, family history, social history, and previous encounter notes.  Time spent on visit including pre-visit chart review and post-visit care and charting was 45 minutes.    Wilhemena Durie, am acting as transcriptionist for [employer], PA-C.  I have reviewed the above documentation for accuracy and completeness, and I agree with the above. [name], PA-C

## 2020-06-19 ENCOUNTER — Ambulatory Visit (INDEPENDENT_AMBULATORY_CARE_PROVIDER_SITE_OTHER): Payer: BC Managed Care – PPO | Admitting: Physician Assistant

## 2020-07-16 ENCOUNTER — Ambulatory Visit (INDEPENDENT_AMBULATORY_CARE_PROVIDER_SITE_OTHER): Payer: BC Managed Care – PPO | Admitting: Physician Assistant

## 2020-07-16 ENCOUNTER — Other Ambulatory Visit: Payer: Self-pay

## 2020-07-16 ENCOUNTER — Encounter (INDEPENDENT_AMBULATORY_CARE_PROVIDER_SITE_OTHER): Payer: Self-pay | Admitting: Physician Assistant

## 2020-07-16 VITALS — BP 121/77 | HR 85 | Temp 98.3°F | Ht 75.0 in | Wt 223.0 lb

## 2020-07-16 DIAGNOSIS — E559 Vitamin D deficiency, unspecified: Secondary | ICD-10-CM

## 2020-07-16 DIAGNOSIS — E7849 Other hyperlipidemia: Secondary | ICD-10-CM | POA: Diagnosis not present

## 2020-07-16 DIAGNOSIS — E1169 Type 2 diabetes mellitus with other specified complication: Secondary | ICD-10-CM

## 2020-07-16 DIAGNOSIS — Z9189 Other specified personal risk factors, not elsewhere classified: Secondary | ICD-10-CM

## 2020-07-16 DIAGNOSIS — Z6833 Body mass index (BMI) 33.0-33.9, adult: Secondary | ICD-10-CM

## 2020-07-16 DIAGNOSIS — E669 Obesity, unspecified: Secondary | ICD-10-CM

## 2020-07-17 LAB — COMPREHENSIVE METABOLIC PANEL
ALT: 16 IU/L (ref 0–44)
AST: 16 IU/L (ref 0–40)
Albumin/Globulin Ratio: 1.9 (ref 1.2–2.2)
Albumin: 5.2 g/dL — ABNORMAL HIGH (ref 3.8–4.9)
Alkaline Phosphatase: 77 IU/L (ref 44–121)
BUN/Creatinine Ratio: 15 (ref 10–24)
BUN: 16 mg/dL (ref 8–27)
Bilirubin Total: 1.1 mg/dL (ref 0.0–1.2)
CO2: 23 mmol/L (ref 20–29)
Calcium: 9.7 mg/dL (ref 8.6–10.2)
Chloride: 102 mmol/L (ref 96–106)
Creatinine, Ser: 1.08 mg/dL (ref 0.76–1.27)
Globulin, Total: 2.7 g/dL (ref 1.5–4.5)
Glucose: 120 mg/dL — ABNORMAL HIGH (ref 65–99)
Potassium: 4.9 mmol/L (ref 3.5–5.2)
Sodium: 141 mmol/L (ref 134–144)
Total Protein: 7.9 g/dL (ref 6.0–8.5)
eGFR: 79 mL/min/{1.73_m2} (ref >59)

## 2020-07-17 LAB — VITAMIN D 25 HYDROXY (VIT D DEFICIENCY, FRACTURES): Vit D, 25-Hydroxy: 55.8 ng/mL (ref 30.0–100.0)

## 2020-07-17 LAB — HEMOGLOBIN A1C
Est. average glucose Bld gHb Est-mCnc: 117 mg/dL
Hgb A1c MFr Bld: 5.7 % — ABNORMAL HIGH (ref 4.8–5.6)

## 2020-07-17 LAB — LIPID PANEL
Chol/HDL Ratio: 2.9 ratio (ref 0.0–5.0)
Cholesterol, Total: 156 mg/dL (ref 100–199)
HDL: 54 mg/dL (ref >39)
LDL Chol Calc (NIH): 90 mg/dL (ref 0–99)
Triglycerides: 56 mg/dL (ref 0–149)
VLDL Cholesterol Cal: 12 mg/dL (ref 5–40)

## 2020-07-17 LAB — INSULIN, RANDOM: INSULIN: 11.1 u[IU]/mL (ref 2.6–24.9)

## 2020-07-18 NOTE — Progress Notes (Signed)
Chief Complaint:   OBESITY Andre Morrison is here to discuss his progress with his obesity treatment plan along with follow-up of his obesity related diagnoses. Andre Morrison is on keeping a food journal and adhering to recommended goals of 1900-2000 calories and 135 grams of protein daily and states he is following his eating plan approximately 100% of the time. Andre Morrison states he is walking, and doing yard work for 60 minutes 3-5 times per week.  Today's visit was #: 18 Starting weight: 279 lbs Starting date: 06/29/2019 Today's weight: 223 lbs Today's date: 07/16/2020 Total lbs lost to date: 56 Total lbs lost since last in-office visit: 6  Interim History: Andre Morrison did very well with weight loss. He is averaging 1900 calories and 135-150 grams of protein daily. His hunger is controlled.  Subjective:   1. Type 2 diabetes mellitus with other specified complication, without long-term current use of insulin (HCC) Andre Morrison is not on medications, and he denies polyphagia. Last A1c was 5.6.  2. Other hyperlipidemia Andre Morrison is not on medications, and he denies chest pain. He is exercising regularly.  3. Vitamin D deficiency Andre Morrison is on OTC Vit D, and he is tolerating it well.  4. At risk for hypoglycemia Andre Morrison is at increased risk for hypoglycemia due to changes in diet, diagnosis of diabetes, and/or insulin use.   Assessment/Plan:   1. Type 2 diabetes mellitus with other specified complication, without long-term current use of insulin (HCC) Good blood sugar control is important to decrease the likelihood of diabetic complications such as nephropathy, neuropathy, limb loss, blindness, coronary artery disease, and death. Intensive lifestyle modification including diet, exercise and weight loss are the first line of treatment for diabetes. We will check labs today, and Andre Morrison will continue his meal plan and exercise.  - Comprehensive metabolic panel - Hemoglobin A1c - Insulin, random  2.  Other hyperlipidemia Cardiovascular risk and specific lipid/LDL goals reviewed.  We discussed several lifestyle modifications today. We will check labs today. Andre Morrison will continue to work his meal plan, and will continue exercise and weight loss efforts. Orders and follow up as documented in patient record.   Counseling Intensive lifestyle modifications are the first line treatment for this issue. . Dietary changes: Increase soluble fiber. Decrease simple carbohydrates. . Exercise changes: Moderate to vigorous-intensity aerobic activity 150 minutes per week if tolerated. . Lipid-lowering medications: see documented in medical record.  - Lipid panel  3. Vitamin D deficiency Low Vitamin D level contributes to fatigue and are associated with obesity, breast, and colon cancer. We will check labs today. Andre Morrison agreed to continue taking OTC Vitamin D and will follow-up for routine testing of Vitamin D, at least 2-3 times per year to avoid over-replacement.  - VITAMIN D 25 Hydroxy (Vit-D Deficiency, Fractures)  4. At risk for hypoglycemia Andre Morrison was given approximately 15 minutes of counseling today regarding prevention of hypoglycemia. He was advised of symptoms of hypoglycemia. Andre Morrison was instructed to avoid skipping meals, eat regular protein rich meals and schedule low calorie snacks as needed.   Repetitive spaced learning was employed today to elicit superior memory formation and behavioral change  5. Class 1 obesity with serious comorbidity and body mass index (BMI) of 33.0 to 33.9 in adult, unspecified obesity type Andre Morrison is currently in the action stage of change. As such, his goal is to continue with weight loss efforts. He has agreed to keeping a food journal and adhering to recommended goals of 1800-2000 calories and 130 grams of protein  daily.   Exercise goals: As is.  Behavioral modification strategies: meal planning and cooking strategies and planning for success.  Andre Morrison has  agreed to follow-up with our clinic in 3 weeks. He was informed of the importance of frequent follow-up visits to maximize his success with intensive lifestyle modifications for his multiple health conditions.   Andre Morrison was informed we would discuss his lab results at his next visit unless there is a critical issue that needs to be addressed sooner. Andre Morrison agreed to keep his next visit at the agreed upon time to discuss these results.  Objective:   Blood pressure 121/77, pulse 85, temperature 98.3 F (36.8 C), height 6\' 3"  (1.905 m), weight 223 lb (101.2 kg), SpO2 98 %. Body mass index is 27.87 kg/m.  General: Cooperative, alert, well developed, in no acute distress. HEENT: Conjunctivae and lids unremarkable. Cardiovascular: Regular rhythm.  Lungs: Normal work of breathing. Neurologic: No focal deficits.   Lab Results  Component Value Date   CREATININE 1.08 07/16/2020   BUN 16 07/16/2020   NA 141 07/16/2020   K 4.9 07/16/2020   CL 102 07/16/2020   CO2 23 07/16/2020   Lab Results  Component Value Date   ALT 16 07/16/2020   AST 16 07/16/2020   ALKPHOS 77 07/16/2020   BILITOT 1.1 07/16/2020   Lab Results  Component Value Date   HGBA1C 5.7 (H) 07/16/2020   HGBA1C 5.6 01/04/2020   HGBA1C 6.4 (H) 06/29/2019   HGBA1C 6.2 (H) 02/21/2018   HGBA1C 6.1 (H) 10/12/2007   Lab Results  Component Value Date   INSULIN 11.1 07/16/2020   INSULIN 10.4 01/04/2020   INSULIN 30.1 (H) 06/29/2019   Lab Results  Component Value Date   TSH 1.33 06/11/2006   Lab Results  Component Value Date   CHOL 156 07/16/2020   HDL 54 07/16/2020   LDLCALC 90 07/16/2020   TRIG 56 07/16/2020   CHOLHDL 2.9 07/16/2020   Lab Results  Component Value Date   WBC 8.5 02/21/2018   HGB 12.7 (L) 02/21/2018   HCT 40.1 02/21/2018   MCV 96.6 02/21/2018   PLT 129 (L) 02/21/2018   Lab Results  Component Value Date   IRON 97 10/12/2007   Attestation Statements:   Reviewed by clinician on day of  visit: allergies, medications, problem list, medical history, surgical history, family history, social history, and previous encounter notes.   Wilhemena Durie, am acting as transcriptionist for [employer], PA-C.  I have reviewed the above documentation for accuracy and completeness, and I agree with the above. [name], PA-C

## 2020-07-24 ENCOUNTER — Encounter: Payer: Self-pay | Admitting: Orthopaedic Surgery

## 2020-07-24 ENCOUNTER — Ambulatory Visit: Payer: Self-pay

## 2020-07-24 ENCOUNTER — Ambulatory Visit: Payer: BC Managed Care – PPO | Admitting: Orthopaedic Surgery

## 2020-07-24 VITALS — Ht 75.0 in | Wt 230.0 lb

## 2020-07-24 DIAGNOSIS — M25561 Pain in right knee: Secondary | ICD-10-CM

## 2020-07-24 DIAGNOSIS — G8929 Other chronic pain: Secondary | ICD-10-CM | POA: Diagnosis not present

## 2020-07-24 NOTE — Progress Notes (Signed)
Office Visit Note   Patient: Andre Morrison           Date of Birth: Jan 01, 1961           MRN: [id number] Visit Date: 07/24/2020              Requested by: Antony Contras, MD [address] [place],  [place] [address] PCP: Antony Contras, MD   Assessment & Plan: Visit Diagnoses:  1. Chronic pain of right knee     Plan: Impression is resolved right knee arthritis flareup.  We have discussed preloading with ibuprofen prior to his extensive walking and working in the yard.  If his symptoms return and worsen or do not improve with time, he will follow-up with [place] for cortisone injection.  Otherwise, follow-up with [place] as needed.  Follow-Up Instructions: Return if symptoms worsen or fail to improve.   Orders:  Orders Placed This Encounter  Procedures  . XR KNEE 3 VIEW RIGHT   No orders of the defined types were placed in this encounter.     Procedures: No procedures performed   Clinical Data: No additional findings.   Subjective: Chief Complaint  Patient presents with  . Right Knee - Pain    HPI patient is a pleasant 60 year old gentleman who comes in today with complaints of right knee pain.  This initially began about a year ago which did not improve after taking turmeric.  His pain returned about 3 weeks ago.  No known injury, but he does note about a month prior to the onset of his most recent episode of pain, he started walking 6 to 9 miles a day.  He increased his dose of turmeric which has recently started to help.  He does not complain of pain today.  When he was having the pain, it is intermittent at the medial aspect.  No mechanical symptoms or instability.  Pain was worse with lying down as well as putting pressure on his knee going up stairs.  He does also take an occasional ibuprofen with mild relief of symptoms.  No previous cortisone injection.  Review of Systems as detailed in HPI.  All others reviewed and are negative.   Objective: Vital  Signs: Ht 6\' 3"  (1.905 m)   Wt 230 lb (104.3 kg)   BMI 28.75 kg/m   Physical Exam well-developed well-nourished gentleman in no acute distress.  Alert oriented x3.  Ortho Exam right knee exam shows no effusion.  Range of motion 0 to 120 degrees.  Mild tenderness to the medial joint line.  Mild patellofemoral crepitus.  Ligaments are stable.  He is neurovascular intact distally.  Specialty Comments:  No specialty comments available.  Imaging: XR KNEE 3 VIEW RIGHT  Result Date: 07/24/2020 Moderate degenerative changes to the medial patellofemoral compartments    PMFS History: Patient Active Problem List   Diagnosis Date Noted  . History of penicillin allergy 05/17/2018  . Left leg cellulitis 02/21/2018  . Nondisplaced fracture of distal end of right radius 09/25/2016  . Displaced transverse fracture of left patella, subsequent encounter for closed fracture with delayed healing 06/20/2016  . Left patella fracture 06/20/2016  . UMBILICAL HERNIA, HX OF 93/23/5573  . PITUITARY INSUFFICIENCY 10/12/2007  . HYPOGONADISM, MALE 10/12/2007  . Sleep apnea 10/12/2007  . Type 2 diabetes mellitus with hyperglycemia ([place]) 06/07/2007  . Essential hypertension 06/07/2007  . DEPRESSION 10/06/2006  . OSTEOPOROSIS 10/06/2006   Past Medical History:  Diagnosis Date  . Basal  cell carcinoma    Nose  . Dysuria   . Edema of both lower extremities   . History of kidney stones   . Hypertension   . Hypogonadism in male   . Kidney stones   . OSA on CPAP   . Overweight   . Pituitary insufficiency ([place])   . Pre-diabetes   . Pre-diabetes   . Umbilical hernia 82/50/5397  . Wears glasses     Family History  Problem Relation Age of Onset  . Liver cancer Father   . Hypertension Father   . Allergic rhinitis Neg Hx   . Angioedema Neg Hx   . Asthma Neg Hx   . Eczema Neg Hx   . Urticaria Neg Hx     Past Surgical History:  Procedure Laterality Date  . COLONOSCOPY     x2  . CYSTOSCOPY W/  URETERAL STENT PLACEMENT Bilateral 12/11/2017   Procedure: CYSTOSCOPY WITH RETROGRADE PYELOGRAM/URETERAL STENT PLACEMENT;  Surgeon: Bjorn Loser, MD;  Location: [facility];  Service: Urology;  Laterality: Bilateral;  . CYSTOSCOPY/URETEROSCOPY/HOLMIUM LASER/STENT PLACEMENT Bilateral 01/08/2018   Procedure: CYSTOSCOPY/URETEROSCOPY/HOLMIUM LASER/STENT REMOVAL AND STENT PLACEMENT, STONE BASKET EXTRACTION;  Surgeon: Ceasar Mons, MD;  Location: [facility];  Service: Urology;  Laterality: Bilateral;  . EXTRACORPOREAL SHOCK WAVE LITHOTRIPSY Left 04-30-2009    dr Terance Hart  . PATELLECTOMY Left 06-27-2016   dr Erlinda Hong   partial  . UMBILICAL HERNIA REPAIR  07-12-2003   dr Bubba Camp   incarcerated  . VARICOSE VEIN SURGERY Left 1976   Social History   Occupational History  . Occupation: Catering manager semi [employer]  Tobacco Use  . Smoking status: Never Smoker  . Smokeless tobacco: Never Used  Vaping Use  . Vaping Use: Never used  Substance and Sexual Activity  . Alcohol use: Yes    Alcohol/week: 1.0 standard drink    Types: 1 Glasses of wine per week    Comment: every other day  . Drug use: Never  . Sexual activity: Not on file

## 2020-08-09 ENCOUNTER — Ambulatory Visit (INDEPENDENT_AMBULATORY_CARE_PROVIDER_SITE_OTHER): Payer: BC Managed Care – PPO | Admitting: Physician Assistant

## 2020-09-19 ENCOUNTER — Ambulatory Visit: Payer: BC Managed Care – PPO | Admitting: Orthopaedic Surgery

## 2020-09-19 ENCOUNTER — Encounter: Payer: Self-pay | Admitting: Orthopaedic Surgery

## 2020-09-19 ENCOUNTER — Other Ambulatory Visit: Payer: Self-pay

## 2020-09-19 ENCOUNTER — Ambulatory Visit: Payer: Self-pay

## 2020-09-19 VITALS — Ht 75.0 in | Wt 240.0 lb

## 2020-09-19 DIAGNOSIS — M25512 Pain in left shoulder: Secondary | ICD-10-CM

## 2020-09-19 DIAGNOSIS — S42112A Displaced fracture of body of scapula, left shoulder, initial encounter for closed fracture: Secondary | ICD-10-CM | POA: Diagnosis not present

## 2020-09-19 DIAGNOSIS — Z96612 Presence of left artificial shoulder joint: Secondary | ICD-10-CM | POA: Diagnosis not present

## 2020-09-19 NOTE — Progress Notes (Signed)
Office Visit Note   Patient: Andre Morrison           Date of Birth: Jun 25, 1960           MRN: [id number] Visit Date: 09/19/2020              Requested by: Antony Contras, MD [address] [place],  [place] [address] PCP: Antony Contras, MD   Assessment & Plan: Visit Diagnoses:  1. Acute pain of left shoulder   2. Closed displaced fracture of body of left scapula, initial encounter   3. Presence of left artificial shoulder joint     Plan: In regards to the T2 fracture we will make a referral to spine surgeon for further treatment.  In regards to the left shoulder he does have a scapular body fracture inferior to the scapular spine which could cause his muscular weakness with rotator cuff especially infraspinatus but I suspect that he may have rotator cuff injury as well therefore we will need to obtain MRI to fully evaluate for structural abnormalities.  He will follow-up after the MRI.  Follow-Up Instructions: Return for follow up after MRI.   Orders:  Orders Placed This Encounter  Procedures  . XR Shoulder Left  . MR Shoulder Left w/o contrast  . Ambulatory referral to Neurosurgery   No orders of the defined types were placed in this encounter.     Procedures: No procedures performed   Clinical Data: No additional findings.   Subjective: Chief Complaint  Patient presents with  . Left Shoulder - Pain, Injury  . Middle Back - Pain, Injury    HPI patient is a pleasant 60 year old gentleman who comes in today following an injury to his left shoulder and upper back.  This was on 09/07/2020 while in [place].  He was riding a horse when he was thrown landing on the posterior left shoulder.  He was seen at [place] where CT scan of the thoracic spine was obtained.  This showed a compression fracture to T2.  He has not really been having much pain to the thoracic spine.  The majority of his pain is to the left shoulder, specifically the deltoid.  He describes  this as stiffness worse with internal rotation and abduction.  He has been taking Tylenol and Advil without significant relief.  He does note occasional burning to the left upper extremity.  Review of Systems as detailed in HPI.  All others reviewed and are negative.   Objective: Vital Signs: Ht 6\' 3"  (1.905 m)   Wt 240 lb (108.9 kg)   BMI 30.00 kg/m   Physical Exam well-developed well-nourished gentleman in no acute distress.  Alert and oriented x3.    Ortho Exam  Left shoulder shows tenderness to palpation of the left scapula.  Passive range of motion is near normal with moderate discomfort.  Manual muscle testing of infraspinatus shows 3 out of 5 strength.  Abduction 4 out of 5 with moderate pain.  Positive drop arm.  3 out of 5 empty can with moderate pain.  Specialty Comments:  No specialty comments available.  Imaging: XR Shoulder Left  Result Date: 09/19/2020 X-rays demonstrate a left scapular body fracture.  Fracture does not extend into the glenoid.      PMFS History: Patient Active Problem List   Diagnosis Date Noted  . History of penicillin allergy 05/17/2018  . Left leg cellulitis 02/21/2018  . Nondisplaced fracture of distal end of right radius 09/25/2016  .  Displaced transverse fracture of left patella, subsequent encounter for closed fracture with delayed healing 06/20/2016  . Left patella fracture 06/20/2016  . UMBILICAL HERNIA, HX OF 01/25/1218  . PITUITARY INSUFFICIENCY 10/12/2007  . HYPOGONADISM, MALE 10/12/2007  . Sleep apnea 10/12/2007  . Type 2 diabetes mellitus with hyperglycemia ([place]) 06/07/2007  . Essential hypertension 06/07/2007  . DEPRESSION 10/06/2006  . OSTEOPOROSIS 10/06/2006   Past Medical History:  Diagnosis Date  . Basal cell carcinoma    Nose  . Dysuria   . Edema of both lower extremities   . History of kidney stones   . Hypertension   . Hypogonadism in male   . Kidney stones   . OSA on CPAP   . Overweight   . Pituitary  insufficiency ([place])   . Pre-diabetes   . Pre-diabetes   . Umbilical hernia 75/88/3254  . Wears glasses     Family History  Problem Relation Age of Onset  . Liver cancer Father   . Hypertension Father   . Allergic rhinitis Neg Hx   . Angioedema Neg Hx   . Asthma Neg Hx   . Eczema Neg Hx   . Urticaria Neg Hx     Past Surgical History:  Procedure Laterality Date  . COLONOSCOPY     x2  . CYSTOSCOPY W/ URETERAL STENT PLACEMENT Bilateral 12/11/2017   Procedure: CYSTOSCOPY WITH RETROGRADE PYELOGRAM/URETERAL STENT PLACEMENT;  Surgeon: Bjorn Loser, MD;  Location: [facility];  Service: Urology;  Laterality: Bilateral;  . CYSTOSCOPY/URETEROSCOPY/HOLMIUM LASER/STENT PLACEMENT Bilateral 01/08/2018   Procedure: CYSTOSCOPY/URETEROSCOPY/HOLMIUM LASER/STENT REMOVAL AND STENT PLACEMENT, STONE BASKET EXTRACTION;  Surgeon: Ceasar Mons, MD;  Location: [facility];  Service: Urology;  Laterality: Bilateral;  . EXTRACORPOREAL SHOCK WAVE LITHOTRIPSY Left 04-30-2009    dr Terance Hart  . PATELLECTOMY Left 06-27-2016   dr Erlinda Hong   partial  . UMBILICAL HERNIA REPAIR  07-12-2003   dr Bubba Camp   incarcerated  . VARICOSE VEIN SURGERY Left 1976   Social History   Occupational History  . Occupation: Catering manager semi [employer]  Tobacco Use  . Smoking status: Never Smoker  . Smokeless tobacco: Never Used  Vaping Use  . Vaping Use: Never used  Substance and Sexual Activity  . Alcohol use: Yes    Alcohol/week: 1.0 standard drink    Types: 1 Glasses of wine per week    Comment: every other day  . Drug use: Never  . Sexual activity: Not on file

## 2020-09-21 ENCOUNTER — Encounter: Payer: Self-pay | Admitting: Orthopaedic Surgery

## 2020-09-21 ENCOUNTER — Encounter (INDEPENDENT_AMBULATORY_CARE_PROVIDER_SITE_OTHER): Payer: Self-pay | Admitting: Physician Assistant

## 2020-09-23 NOTE — Telephone Encounter (Signed)
Please review

## 2020-10-02 ENCOUNTER — Other Ambulatory Visit: Payer: Self-pay | Admitting: Student

## 2020-10-03 ENCOUNTER — Ambulatory Visit
Admission: RE | Admit: 2020-10-03 | Discharge: 2020-10-03 | Disposition: A | Discharge status: Home / Self Care | Payer: BC Managed Care – PPO | Source: Ambulatory Visit | Attending: Orthopaedic Surgery | Admitting: Orthopaedic Surgery

## 2020-10-03 ENCOUNTER — Other Ambulatory Visit: Payer: Self-pay

## 2020-10-03 DIAGNOSIS — Z96612 Presence of left artificial shoulder joint: Secondary | ICD-10-CM

## 2020-10-05 ENCOUNTER — Telehealth: Payer: Self-pay

## 2020-10-05 NOTE — Telephone Encounter (Signed)
Called and spoke with Nira Conn. She had questions regarding why they were getting a referral for a scapula fracture. I explained that he was being referred for a T2 fracture. She understands and states that she will call patient to schedule.

## 2020-10-05 NOTE — Telephone Encounter (Signed)
Heather from [facility] called regarding a referral she received she is requesting a call [phone]

## 2020-10-10 ENCOUNTER — Ambulatory Visit: Payer: BC Managed Care – PPO | Admitting: Orthopaedic Surgery

## 2020-10-10 ENCOUNTER — Other Ambulatory Visit: Payer: Self-pay

## 2020-10-10 ENCOUNTER — Encounter: Payer: Self-pay | Admitting: Orthopaedic Surgery

## 2020-10-10 DIAGNOSIS — S42112A Displaced fracture of body of scapula, left shoulder, initial encounter for closed fracture: Secondary | ICD-10-CM

## 2020-10-10 NOTE — Progress Notes (Signed)
Office Visit Note   Patient: Andre Morrison           Date of Birth: [birth year]           MRN: [id number] Visit Date: 10/10/2020              Requested by: Antony Contras, MD [place],  [place] [id number] PCP: Antony Contras, MD   Assessment & Plan: Visit Diagnoses:  1. Closed displaced fracture of body of left scapula, initial encounter     Plan: MRIs does not show any full-thickness rotator cuff tears.  Clinically he has improved significantly.  I do not think that he will need formal outpatient PT.  Home exercises were provided to start in about a week from now.  Recheck in 8 weeks.  Follow-Up Instructions: Return in about 8 weeks (around 12/05/2020).   Orders:  No orders of the defined types were placed in this encounter.  No orders of the defined types were placed in this encounter.     Procedures: No procedures performed   Clinical Data: No additional findings.   Subjective: Chief Complaint  Patient presents with   Left Shoulder - Pain    Andre Morrison is following up today for his scapular body fracture as well as a recent shoulder MRI.  He has felt significant improvement in his left shoulder pain and function.   Review of Systems   Objective: Vital Signs: There were no vitals taken for this visit.  Physical Exam  Ortho Exam Left shoulder shows near full range of motion without any significant discomfort.  He has slight weakness to manual muscle testing secondary to pain. Specialty Comments:  No specialty comments available.  Imaging: No results found.   PMFS History: Patient Active Problem List   Diagnosis Date Noted   History of penicillin allergy 05/17/2018   Left leg cellulitis 02/21/2018   Nondisplaced fracture of distal end of right radius 09/25/2016   Displaced transverse fracture of left patella, subsequent encounter for closed fracture with delayed healing 06/20/2016   Left patella fracture 76/73/4193   UMBILICAL  HERNIA, HX OF 10/26/2007   PITUITARY INSUFFICIENCY 10/12/2007   HYPOGONADISM, MALE 10/12/2007   Sleep apnea 10/12/2007   Type 2 diabetes mellitus with hyperglycemia ([place]) 06/07/2007   Essential hypertension 06/07/2007   DEPRESSION 10/06/2006   OSTEOPOROSIS 10/06/2006   Past Medical History:  Diagnosis Date   Basal cell carcinoma    Nose   Dysuria    Edema of both lower extremities    History of kidney stones    Hypertension    Hypogonadism in male    Kidney stones    OSA on CPAP    Overweight    Pituitary insufficiency ([facility])    Pre-diabetes    Pre-diabetes    Umbilical hernia 79/05/4095   Wears glasses     Family History  Problem Relation Age of Onset   Liver cancer Father    Hypertension Father    Allergic rhinitis Neg Hx    Angioedema Neg Hx    Asthma Neg Hx    Eczema Neg Hx    Urticaria Neg Hx     Past Surgical History:  Procedure Laterality Date   COLONOSCOPY     x2   CYSTOSCOPY W/ URETERAL STENT PLACEMENT Bilateral 12/11/2017   Procedure: CYSTOSCOPY WITH RETROGRADE PYELOGRAM/URETERAL STENT PLACEMENT;  Surgeon: Bjorn Loser, MD;  Location: [facility];  Service: Urology;  Laterality: Bilateral;   CYSTOSCOPY/URETEROSCOPY/HOLMIUM  LASER/STENT PLACEMENT Bilateral 01/08/2018   Procedure: CYSTOSCOPY/URETEROSCOPY/HOLMIUM LASER/STENT REMOVAL AND STENT PLACEMENT, STONE BASKET EXTRACTION;  Surgeon: Ceasar Mons, MD;  Location: [facility];  Service: Urology;  Laterality: Bilateral;   EXTRACORPOREAL SHOCK WAVE LITHOTRIPSY Left 04-30-2009    dr Terance Hart   PATELLECTOMY Left 06-27-2016   dr Erlinda Hong   partial   UMBILICAL HERNIA REPAIR  07-12-2003   dr Bubba Camp   incarcerated   VARICOSE VEIN SURGERY Left 1976   Social History   Occupational History   Occupation: Catering manager [employer]  Tobacco Use   Smoking status: Never   Smokeless tobacco: Never  Vaping Use   Vaping Use: Never used  Substance and Sexual  Activity   Alcohol use: Yes    Alcohol/week: 1.0 standard drink    Types: 1 Glasses of wine per week    Comment: every other day   Drug use: Never   Sexual activity: Not on file

## 2020-12-11 ENCOUNTER — Ambulatory Visit: Payer: BC Managed Care – PPO | Admitting: Orthopaedic Surgery

## 2020-12-11 ENCOUNTER — Other Ambulatory Visit: Payer: Self-pay

## 2020-12-11 ENCOUNTER — Encounter: Payer: Self-pay | Admitting: Orthopaedic Surgery

## 2020-12-11 DIAGNOSIS — S42112A Displaced fracture of body of scapula, left shoulder, initial encounter for closed fracture: Secondary | ICD-10-CM

## 2020-12-11 NOTE — Progress Notes (Signed)
Office Visit Note   Patient: Andre Morrison           Date of Birth: May 02, 1960           MRN: [id number] Visit Date: 12/11/2020              Requested by: Antony Contras, MD [place],   [id number] PCP: Antony Contras, MD   Assessment & Plan: Visit Diagnoses:  1. Closed displaced fracture of body of left scapula, initial encounter     Plan: Impression is approximately 60-monthstatus post left nondisplaced scapular body fracture.  Patient has nearly fully recovered.  He will continue to advance with activity as tolerated.  [place]  Follow-Up Instructions: Return if symptoms worsen or fail to improve.   Orders:  No orders of the defined types were placed in this encounter.  No orders of the defined types were placed in this encounter.     Procedures: No procedures performed   Clinical Data: No additional findings.   Subjective: Chief Complaint  Patient presents with   Left Shoulder - Pain    HPI patient is a pleasant 60year old gentleman who comes in for follow-up of his left scapular body fracture.  [place]  [place] has been doing much better.  He denies any pain to the shoulder blade or left shoulder.  Overall, he notes that he is 95% better.     Objective: Vital Signs: There were no vitals taken for this visit.    Ortho Exam examination of the left scapula shows no tenderness.  Full range of motion of the shoulder without pain.  He is neurovascular intact distally.  Specialty Comments:  No specialty comments available.  Imaging: No new imaging   PMFS History: Patient Active Problem List   Diagnosis Date Noted   History of penicillin allergy 05/17/2018   Left leg cellulitis 02/21/2018   Nondisplaced fracture of distal end of right radius 09/25/2016   Displaced transverse fracture of left patella, subsequent encounter for closed fracture with delayed healing  06/20/2016   Left patella fracture [id number]  UMBILICAL HERNIA, HX OF [id number]  PITUITARY INSUFFICIENCY 10/12/2007   HYPOGONADISM, MALE 10/12/2007   Sleep apnea 10/12/2007   [place] 2 [place]cemia [place] 06/07/2007   Essential hypertension 06/07/2007   DEPRESSION 10/06/2006   OSTEOPOROSIS 10/06/2006   Past Medical History:  Diagnosis Date   Basal cell carcinoma    Nose   Dysuria    Edema of both lower extremities    History of kidney stones    Hypertension    Hypogonadism in male    Kidney stones    OSA on CPAP    Overweight    Pituitary insufficiency (HCC)    Pre-diabetes    Pre-diabetes    Umbilical hernia [id number]  Wears glasses     Family History  Problem Relation Age of Onset   Liver cancer Father    Hypertension Father    Allergic rhinitis Neg Hx    Angioedema Neg Hx    Asthma Neg Hx    Eczema Neg Hx    Urticaria Neg Hx     Past Surgical History:  Procedure Laterality Date   COLONOSCOPY     x2   CYSTOSCOPY W/ URETERAL STENT PLACEMENT Bilateral 12/11/2017   Procedure: CYSTOSCOPY WITH RETROGRADE PYELOGRAM/URETERAL STENT PLACEMENT;  Surgeon: MBjorn Loser MD;  Location: [facility];  Service: Urology;  Laterality: Bilateral;   CYSTOSCOPY/URETEROSCOPY/HOLMIUM LASER/STENT PLACEMENT Bilateral 01/08/2018   Procedure: CYSTOSCOPY/URETEROSCOPY/HOLMIUM LASER/STENT REMOVAL AND STENT PLACEMENT, STONE BASKET EXTRACTION;  Surgeon: Ceasar Mons, MD;  Location: [facility];  Service: Urology;  Laterality: Bilateral;   EXTRACORPOREAL SHOCK WAVE LITHOTRIPSY Left 04-30-2009    dr Terance Hart   PATELLECTOMY Left 06-27-2016   dr Erlinda Hong   partial   UMBILICAL HERNIA REPAIR  07-12-2003   dr Bubba Camp   incarcerated   VARICOSE VEIN SURGERY Left 1976   Social History   Occupational History   Occupation: Catering manager semi [employer]  Tobacco Use   Smoking status: Never   Smokeless tobacco: Never   Vaping Use   Vaping Use: Never used  Substance and Sexual Activity   Alcohol use: Yes    Alcohol/week: 1.0 standard drink    Types: 1 Glasses of wine per week    Comment: every other day   Drug use: Never   Sexual activity: Not on file

## 2021-11-27 ENCOUNTER — Encounter (INDEPENDENT_AMBULATORY_CARE_PROVIDER_SITE_OTHER): Payer: Self-pay

## 2021-12-11 IMAGING — MR MR SHOULDER*L* W/O CM
4 of 5 series · 20 of 40 positions shown · non-contrast
Comparison: Plain films left shoulder 09/19/2020.

CLINICAL DATA: Left shoulder pain for 1 month since the patient
suffered a scapular fracture when he was thrown off a horse.
Subsequent encounter.

EXAM:
MRI OF THE LEFT SHOULDER WITHOUT CONTRAST
TECHNIQUE: Multiplanar, multisequence MR imaging of the shoulder was performed.
No intravenous contrast was administered.

[Series 6: T2 fat-sat · axial · left · 3.0mm · 0.47mm/px · z∈[-40,+61]mm · 8 of 27 slices shown (1 of 3)]
[im 1/27]
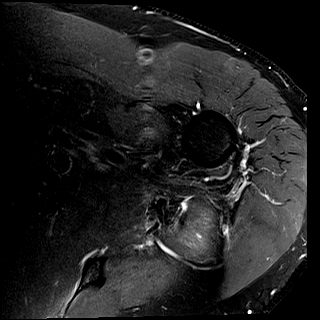
[im 4/27]
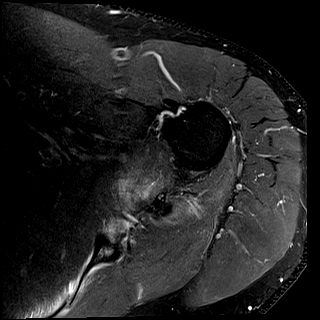
[im 8/27]
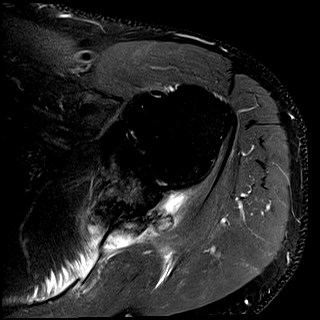
[im 12/27]
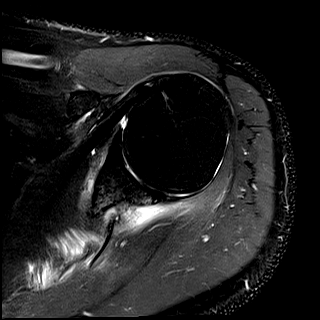
[im 15/27]
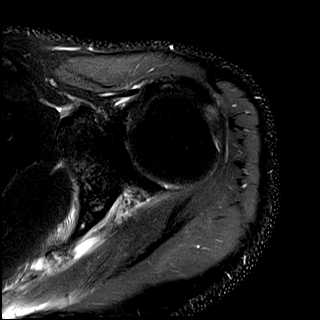
[im 19/27]
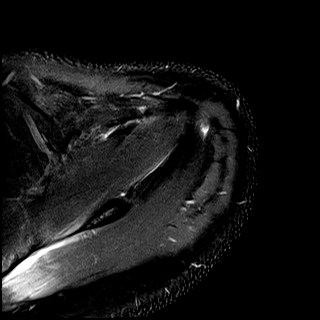
[im 23/27]
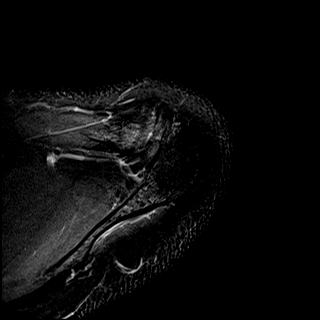
[im 27/27]
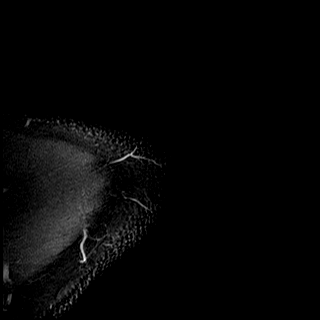

[Series 7: T2 fat-sat · oblique · left · 4.0mm · 0.25mm/px · 3 of 21 slices shown (2 of 3)]
[im 5/21]
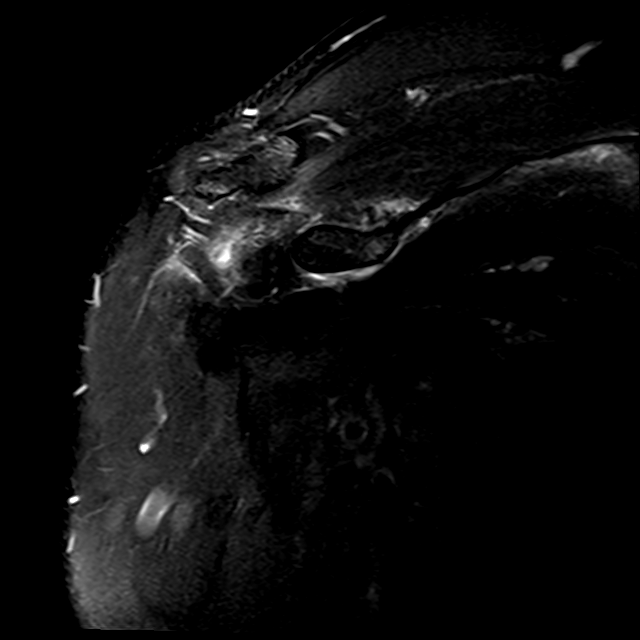
[im 13/21]
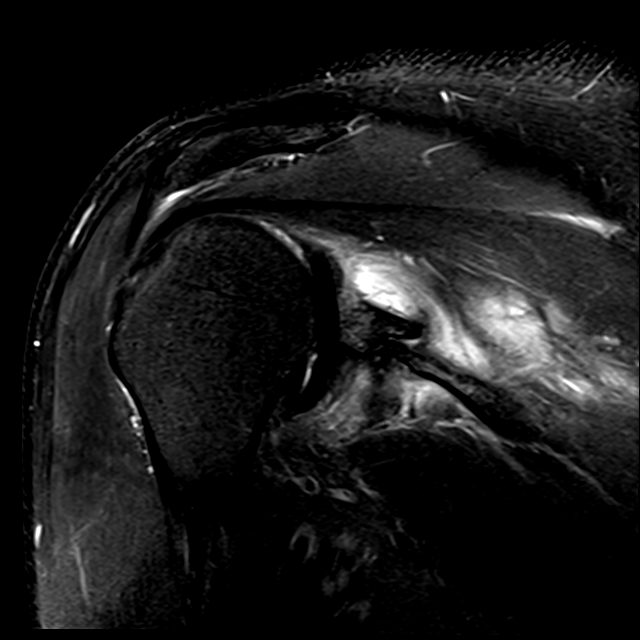
[im 21/21]
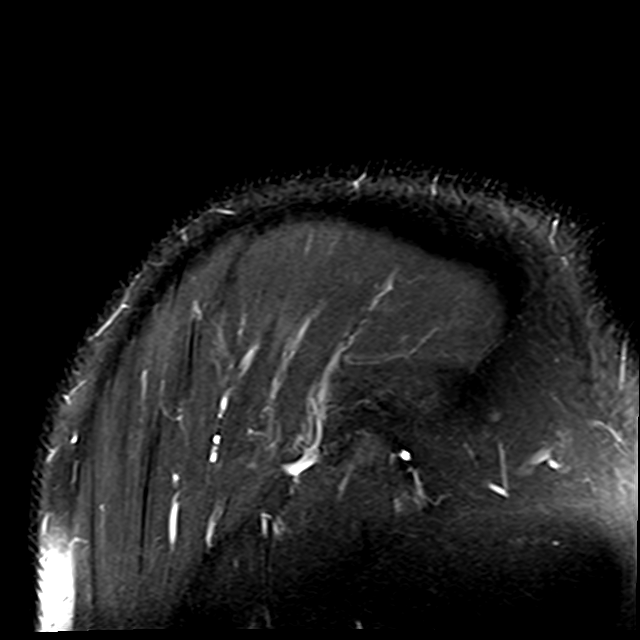

[Series 8: PD · oblique · left · 4.0mm · 0.22mm/px · 6 of 21 slices shown]
[im 1/21]
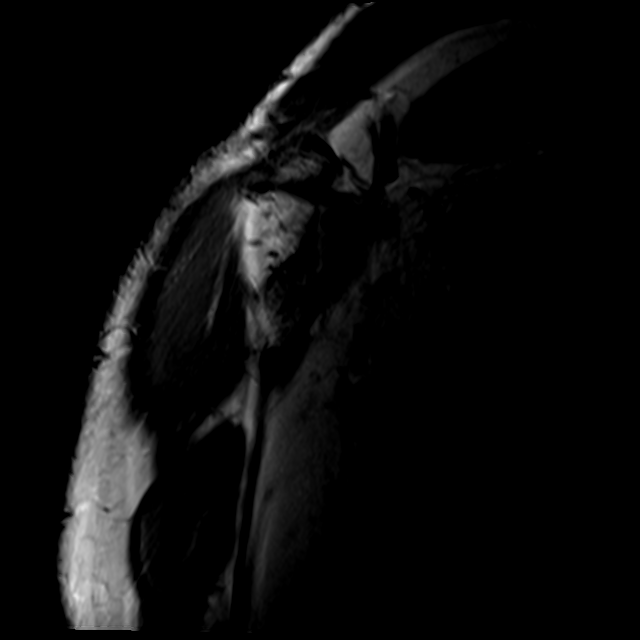
[im 5/21]
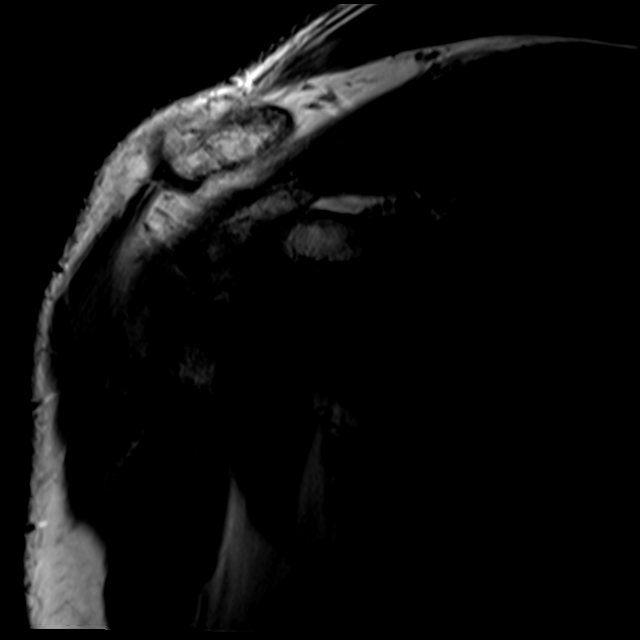
[im 9/21]
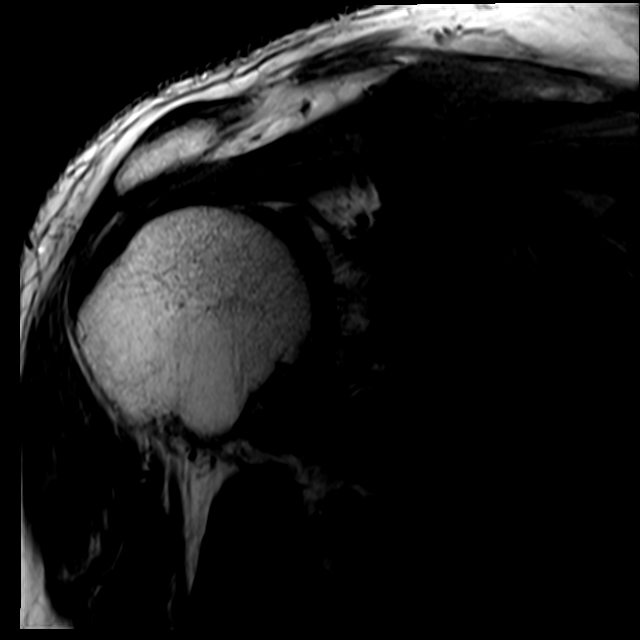
[im 13/21]
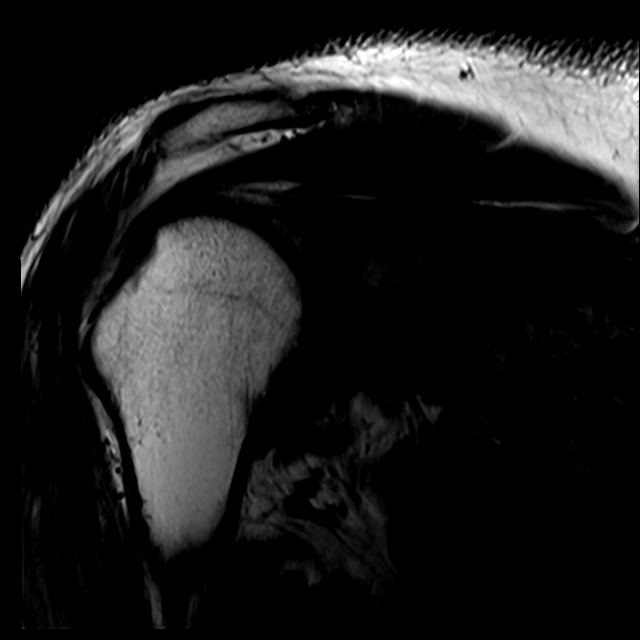
[im 17/21]
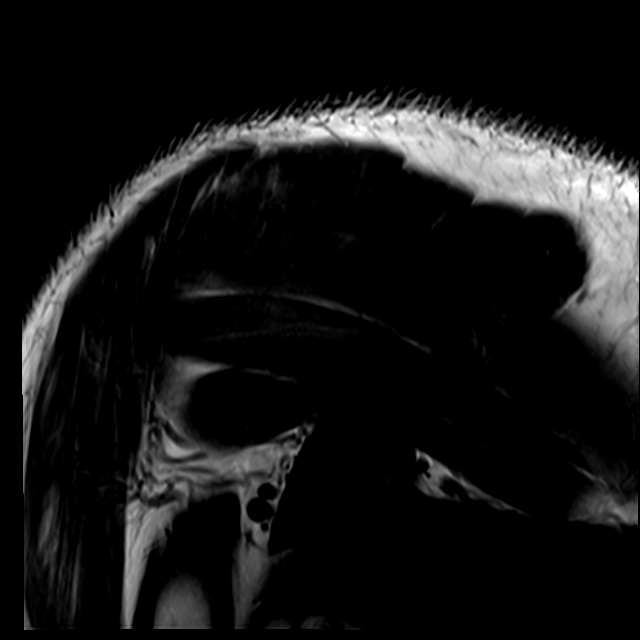
[im 21/21]
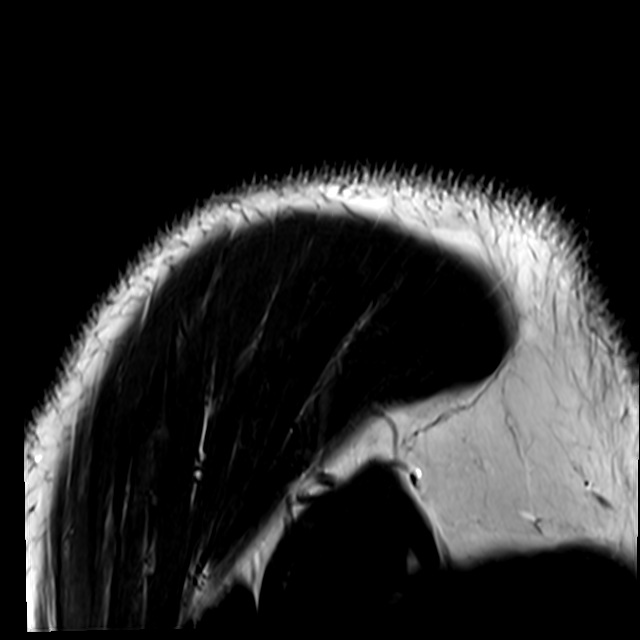

[Series 9: T2 fat-sat · oblique · left · 4.0mm · 0.44mm/px · 3 of 34 slices shown (3 of 3)]
[im 4/34]
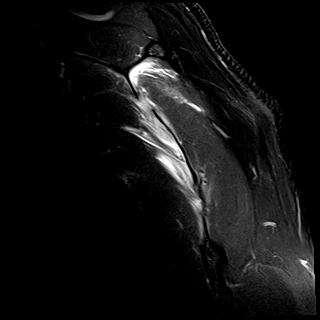
[im 19/34]
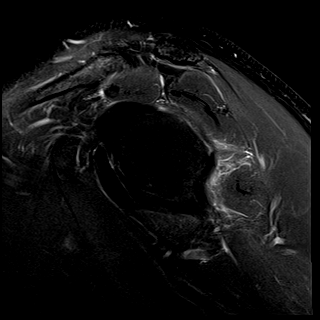
[im 30/34]
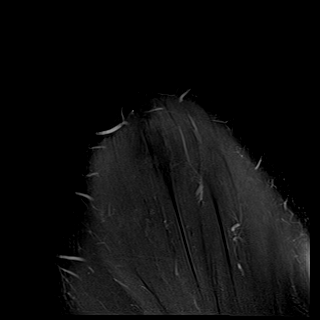

[20 of 40 positions shown; findings below may reference images not displayed]

FINDINGS: Rotator cuff: Intact. Mild supraspinatus and infraspinatus
tendinopathy.

Muscles: No atrophy or focal lesion. There is mild edema in the
rotator cuff muscles about the patient's known scapular fracture.

Biceps long head:  Intact.

Acromioclavicular Joint: Moderate osteoarthritis. Type 1 acromion.
Small volume of fluid in the subacromial/subdeltoid bursa.

Glenohumeral Joint: Mild degenerative change is seen with cartilage
thinning. The articular surface of the glenoid is preserved without
fracture. The patient's scapular fracture extends to the base of the
glenoid neck there is posterior displacement approximately 0.6 cm
and fragment override of approximately 0.8 cm. This component of the
fracture demonstrates healing with bridging bone present.

Labrum:  Intact.

Bones: The component of the fracture through the scapular body is
mildly comminuted. Although the component of the fracture of the
glenoid neck is healing, no bridging bone is seen across the
fracture through the body. The fracture is incompletely visualized
but the component of the fracture through the body demonstrates
approximately 0.9 cm posterior displacement of the main inferior
fragment. The coracoid process is intact.

Other: None.
IMPRESSION: Comminuted fracture of the scapular body extending into the neck
without involvement of the articular surface of the glenoid or the
coracoid process. The component of the fracture through the neck
appears to be healing and with posterior displacement of
approximately 0.6 cm and fragment override of 0.8 cm. More medially,
no bridging bone is seen through the fracture of the body and there
is posterior displacement the main component of the fracture through
the body of approximately 1 cm. Mild edema in the rotator cuff
muscles about the site of the fracture is noted. If indicated CT is
a better test to evaluate, the patient's scapular fracture.

Mild supraspinatus and infraspinatus tendinopathy without tear.

Moderate acromioclavicular and mild glenohumeral osteoarthritis.

## 2022-01-23 ENCOUNTER — Ambulatory Visit: Payer: BC Managed Care – PPO | Admitting: Podiatry

## 2022-01-23 DIAGNOSIS — E119 Type 2 diabetes mellitus without complications: Secondary | ICD-10-CM | POA: Diagnosis not present

## 2022-01-23 DIAGNOSIS — R7303 Prediabetes: Secondary | ICD-10-CM

## 2022-01-26 NOTE — Progress Notes (Signed)
  Subjective:  Patient ID: Andre Morrison, male    DOB: 29-Nov-1960,  MRN: [id number]  Chief Complaint  Patient presents with   Diabetes    Patient states that his PCP sent him to [place] for foot exam. His current A1C is 6.1, previous was 5.9. In the past he was as high as 6.6/6.7    61 y.o. male presents with the above complaint. History confirmed with patient.   Objective:  Physical Exam: warm, good capillary refill, no trophic changes or ulcerative lesions, normal DP and PT pulses, normal monofilament exam, and normal sensory exam.  Assessment:   1. Prediabetes      Plan:  Patient was evaluated and treated and all questions answered.   Patient educated on diabetes. Discussed proper diabetic foot care and discussed risks and complications of disease. Educated patient in depth on reasons to return to the office immediately should he/she discover anything concerning or new on the feet. All questions answered. Discussed proper shoes as well.  He is at risk of complications   Return if symptoms worsen or fail to improve.
